# Patient Record
Sex: Female | Born: 1945 | Race: White | Hispanic: No | Smoking: Never smoker
Health system: Southern US, Community
[De-identification: ages and names within clinical notes are randomized; demographics above are authoritative.]

## PROBLEM LIST (undated history)

## (undated) DIAGNOSIS — I671 Cerebral aneurysm, nonruptured: Secondary | ICD-10-CM

## (undated) DIAGNOSIS — I10 Essential (primary) hypertension: Secondary | ICD-10-CM

## (undated) DIAGNOSIS — R7303 Prediabetes: Secondary | ICD-10-CM

## (undated) DIAGNOSIS — M1990 Unspecified osteoarthritis, unspecified site: Secondary | ICD-10-CM

## (undated) HISTORY — PX: CHOLECYSTECTOMY: SHX55

## (undated) HISTORY — PX: BACK SURGERY: SHX140

## (undated) HISTORY — PX: APPENDECTOMY: SHX54

## (undated) HISTORY — PX: CARPAL TUNNEL RELEASE: SHX101

## (undated) HISTORY — PX: FRACTURE SURGERY: SHX138

## (undated) HISTORY — PX: ABDOMINAL HYSTERECTOMY: SHX81

---

## 2006-06-16 ENCOUNTER — Ambulatory Visit: Payer: Self-pay

## 2006-09-04 ENCOUNTER — Ambulatory Visit: Payer: Self-pay | Admitting: Unknown Physician Specialty

## 2008-06-13 ENCOUNTER — Ambulatory Visit: Payer: Self-pay | Admitting: Internal Medicine

## 2008-07-17 ENCOUNTER — Ambulatory Visit: Payer: Self-pay | Admitting: Gastroenterology

## 2009-09-24 ENCOUNTER — Ambulatory Visit: Payer: Self-pay | Admitting: Internal Medicine

## 2010-06-17 ENCOUNTER — Ambulatory Visit: Payer: Self-pay | Admitting: Internal Medicine

## 2012-04-22 ENCOUNTER — Ambulatory Visit: Payer: Self-pay | Admitting: Internal Medicine

## 2012-11-29 ENCOUNTER — Emergency Department: Payer: Self-pay | Admitting: Emergency Medicine

## 2012-12-02 ENCOUNTER — Emergency Department: Payer: Self-pay | Admitting: Emergency Medicine

## 2013-04-07 ENCOUNTER — Ambulatory Visit: Payer: Self-pay | Admitting: Internal Medicine

## 2013-04-10 ENCOUNTER — Ambulatory Visit: Payer: Self-pay | Admitting: Internal Medicine

## 2014-03-06 ENCOUNTER — Ambulatory Visit: Payer: Self-pay | Admitting: Internal Medicine

## 2014-03-14 ENCOUNTER — Ambulatory Visit: Payer: Self-pay | Admitting: Internal Medicine

## 2014-08-17 ENCOUNTER — Other Ambulatory Visit: Payer: Self-pay | Admitting: Internal Medicine

## 2014-08-17 DIAGNOSIS — Z1231 Encounter for screening mammogram for malignant neoplasm of breast: Secondary | ICD-10-CM

## 2014-08-21 ENCOUNTER — Other Ambulatory Visit: Payer: Self-pay | Admitting: Internal Medicine

## 2014-08-21 DIAGNOSIS — Z1231 Encounter for screening mammogram for malignant neoplasm of breast: Secondary | ICD-10-CM

## 2014-09-12 ENCOUNTER — Ambulatory Visit
Admission: RE | Admit: 2014-09-12 | Discharge: 2014-09-12 | Disposition: A | Discharge status: Home / Self Care | Payer: Medicare HMO | Source: Ambulatory Visit | Attending: Internal Medicine | Admitting: Internal Medicine

## 2014-09-12 ENCOUNTER — Other Ambulatory Visit: Payer: Self-pay | Admitting: Internal Medicine

## 2014-09-12 ENCOUNTER — Ambulatory Visit: Payer: Medicare HMO

## 2014-09-12 DIAGNOSIS — Z1231 Encounter for screening mammogram for malignant neoplasm of breast: Secondary | ICD-10-CM

## 2014-09-12 DIAGNOSIS — R921 Mammographic calcification found on diagnostic imaging of breast: Secondary | ICD-10-CM | POA: Insufficient documentation

## 2015-04-17 ENCOUNTER — Other Ambulatory Visit: Payer: Self-pay | Admitting: Internal Medicine

## 2015-04-17 DIAGNOSIS — R928 Other abnormal and inconclusive findings on diagnostic imaging of breast: Secondary | ICD-10-CM

## 2015-05-01 ENCOUNTER — Ambulatory Visit
Admission: RE | Admit: 2015-05-01 | Discharge: 2015-05-01 | Disposition: A | Discharge status: Home / Self Care | Payer: Medicare HMO | Source: Ambulatory Visit | Attending: Internal Medicine | Admitting: Internal Medicine

## 2015-05-01 DIAGNOSIS — Z1231 Encounter for screening mammogram for malignant neoplasm of breast: Secondary | ICD-10-CM | POA: Insufficient documentation

## 2015-05-01 DIAGNOSIS — R928 Other abnormal and inconclusive findings on diagnostic imaging of breast: Secondary | ICD-10-CM

## 2016-01-18 ENCOUNTER — Emergency Department
Admission: EM | Admit: 2016-01-18 | Discharge: 2016-01-18 | Disposition: A | Discharge status: Home / Self Care | Payer: Medicare HMO | Attending: Emergency Medicine | Admitting: Emergency Medicine

## 2016-01-18 ENCOUNTER — Encounter: Payer: Self-pay | Admitting: Emergency Medicine

## 2016-01-18 DIAGNOSIS — M545 Low back pain: Secondary | ICD-10-CM | POA: Insufficient documentation

## 2016-01-18 DIAGNOSIS — G8929 Other chronic pain: Secondary | ICD-10-CM | POA: Diagnosis not present

## 2016-01-18 DIAGNOSIS — I10 Essential (primary) hypertension: Secondary | ICD-10-CM | POA: Insufficient documentation

## 2016-01-18 DIAGNOSIS — M549 Dorsalgia, unspecified: Secondary | ICD-10-CM

## 2016-01-18 MED ORDER — HYDROMORPHONE HCL 1 MG/ML IJ SOLN
1.0000 mg | Freq: Once | INTRAMUSCULAR | Status: AC
  Administered 2016-01-18: 1 mg via INTRAMUSCULAR
  Filled 2016-01-18: qty 1

## 2016-01-18 MED ORDER — TRAMADOL HCL 50 MG PO TABS
50.0000 mg | ORAL_TABLET | Freq: Four times a day (QID) | ORAL | 0 refills | Status: DC | PRN
Start: 2016-01-18 — End: 2016-02-25

## 2016-01-18 MED ORDER — KETOROLAC TROMETHAMINE 60 MG/2ML IM SOLN
30.0000 mg | Freq: Once | INTRAMUSCULAR | Status: AC
  Administered 2016-01-18: 30 mg via INTRAMUSCULAR
  Filled 2016-01-18: qty 2

## 2016-01-18 NOTE — ED Triage Notes (Signed)
Pt with chronic pain but otc meds not helping. Was seen at [facility] same earlier this week. Pt ambulated to triage with no difficulty noted.

## 2016-01-18 NOTE — ED Provider Notes (Signed)
[facility] Emergency Department Provider Note   ____________________________________________   None    (approximate)  I have reviewed the triage vital signs and the nursing notes.   HISTORY  Chief Complaint Back Pain    HPI Krista Anderson is a 70 y.o. female patient complain of chronic low back pain with radicular component to the right leg. Patient denies any bladder or bowel dysfunction. Patient states she was seen at 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 Patient state medications are not helping. Patient currently rates the pain as a 10 over 10. Patient described the pain as sharp. No other palliative measures for his complaint.   History reviewed. No pertinent past medical history.  There are no active problems to display for this patient.   Past Surgical History:  Procedure Laterality Date  . ABDOMINAL HYSTERECTOMY      Prior to Admission medications   Not on File    Allergies Amitriptyline and Sulfur  No family history on file.  Social History Social History  Substance Use Topics  . Smoking status: Never Smoker  . Smokeless tobacco: Never Used  . Alcohol use No    Review of Systems Constitutional: No fever/chills Eyes: No visual changes. ENT: No sore throat. Cardiovascular: Denies chest pain. Respiratory: Denies shortness of breath. Gastrointestinal: No abdominal pain.  No nausea, no vomiting.  No diarrhea.  No constipation. Genitourinary: Negative for dysuria. Musculoskeletal:Positive for chronic back pain  Skin: Negative for rash. Neurological: Negative for headaches, focal weakness or numbness. Endocrine:Hypertension and borderline diabetes. Allergic/Immunilogical: See medication list ____________________________________________   PHYSICAL EXAM:  VITAL  SIGNS: ED Triage Vitals  Enc Vitals Group     BP 01/18/16 0927 (!) 192/67     Pulse Rate 01/18/16 0927 (!) 58     Resp 01/18/16 0927 18     Temp 01/18/16 0927 97.5 F (36.4 C)     Temp Source 01/18/16 0927 Oral     SpO2 01/18/16 0927 95 %     Weight 01/18/16 0929 229 lb (103.9 kg)     Height 01/18/16 0929 5\' 4"  (1.626 m)     Head Circumference --      Peak Flow --      Pain Score 01/18/16 0929 10     Pain Loc --      Pain Edu? --      Excl. in GC? --     Constitutional: Alert and oriented. Well appearing and in no acute distress. Eyes: Conjunctivae are normal. PERRL. EOMI. Head: Atraumatic. Nose: No congestion/rhinnorhea. Mouth/Throat: Mucous membranes are moist.  Oropharynx non-erythematous. Neck: No stridor.  No cervical spine tenderness to palpation. Hematological/Lymphatic/Immunilogical: No cervical lymphadenopathy. Cardiovascular: Normal rate, regular rhythm. Grossly normal heart sounds.  Good peripheral circulation.Elevated blood pressure will be retaken before [facility] to [facility]. Respiratory: Normal respiratory effort.  No retractions. Lungs CTAB. Gastrointestinal: Soft and nontender. No distention. No abdominal bruits. No CVA tenderness. Musculoskeletal: No obvious deformity to the lumbar spine. Surgical scars consistent with previous history. Patient has some moderate guarding palpation of L3-S1. Patient had negative straight leg test. Patient states this time her Lasix on upper extremity. Patient has a normal gait. Neurologic:  Normal speech and language. No gross focal neurologic deficits are appreciated. No gait instability. Skin:  Skin is warm, dry and intact. No rash noted. Psychiatric:  Mood and affect are normal. Speech and behavior are normal.  ____________________________________________   LABS (all labs ordered are listed, but only abnormal results are displayed)  Labs Reviewed - No data to  display ____________________________________________  EKG   ____________________________________________  RADIOLOGY   ____________________________________________   PROCEDURES  Procedure(s) performed: None  Procedures  Critical Care performed: No  ____________________________________________   INITIAL IMPRESSION / ASSESSMENT AND PLAN / ED COURSE  Pertinent labs & imaging results that were available during my care of the patient were reviewed by me and considered in my medical decision making (see chart for details). Chronic back pain. Patient advised to continue previous medication. Patient given a prescription for tramadol and advised to follow-up with family doctor to consider consult pain management for continued care.  Clinical Course   Discussed with patient the rationale to see her family doctor to consider consult to pain management clinic. Advised patient were happy to treat her today for pain complaint but must see continued care outside of the emergency room for this complaint.  ____________________________________________   FINAL CLINICAL IMPRESSION(S) / ED DIAGNOSES  Final diagnoses:  None      NEW MEDICATIONS STARTED DURING THIS VISIT:  New Prescriptions   No medications on file     Note:  This document was prepared using Dragon voice recognition software and may include unintentional dictation errors.    Joni Reining, PA-C 01/18/16 1100    Arnaldo Natal, MD 01/18/16 [phone]

## 2016-01-18 NOTE — ED Notes (Signed)
Patient states that she has been having lower back pain with radiculopathy into the right leg. Patient was seen at [facility] on Wednesday and states that she was given Voltaren gel, Prednisone, and Zanaflex but this has not helped. Patient states that she was unable to sleep last night due to the pain.

## 2016-01-23 ENCOUNTER — Encounter: Payer: Self-pay | Admitting: Emergency Medicine

## 2016-01-23 ENCOUNTER — Emergency Department: Payer: Medicare HMO

## 2016-01-23 ENCOUNTER — Emergency Department
Admission: EM | Admit: 2016-01-23 | Discharge: 2016-01-23 | Disposition: A | Discharge status: Home / Self Care | Payer: Medicare HMO | Attending: Emergency Medicine | Admitting: Emergency Medicine

## 2016-01-23 DIAGNOSIS — I10 Essential (primary) hypertension: Secondary | ICD-10-CM | POA: Insufficient documentation

## 2016-01-23 DIAGNOSIS — M545 Low back pain: Secondary | ICD-10-CM | POA: Diagnosis not present

## 2016-01-23 DIAGNOSIS — M549 Dorsalgia, unspecified: Secondary | ICD-10-CM

## 2016-01-23 DIAGNOSIS — G8929 Other chronic pain: Secondary | ICD-10-CM | POA: Diagnosis not present

## 2016-01-23 HISTORY — DX: Essential (primary) hypertension: I10

## 2016-01-23 HISTORY — DX: Prediabetes: R73.03

## 2016-01-23 MED ORDER — NAPROXEN 500 MG PO TABS: 500.0000 mg | ORAL_TABLET | Freq: Two times a day (BID) | ORAL | 0 refills | Status: DC

## 2016-01-23 MED ORDER — BACLOFEN 10 MG PO TABS: 10.0000 mg | ORAL_TABLET | Freq: Three times a day (TID) | ORAL | 0 refills | Status: DC

## 2016-01-23 MED ORDER — GABAPENTIN 100 MG PO CAPS: 100.0000 mg | ORAL_CAPSULE | Freq: Three times a day (TID) | ORAL | 0 refills | Status: DC

## 2016-01-23 NOTE — ED Triage Notes (Signed)
Pt reports history of back surgeries in the past. Reports for the past week her lower back has been hurting and now she has pain in her right hip radiating down her right leg and causing some numbness. Denies recent injuries.

## 2016-01-23 NOTE — ED Provider Notes (Signed)
[facility] Emergency Department Provider Note   ____________________________________________   None    (approximate)  I have reviewed the triage vital signs and the nursing notes.   HISTORY  Chief Complaint Hip Pain and Back Pain    HPI Krista Anderson is a 70 y.o. female presents for evaluation of low back and right hip pain. She states numbness and tingling radiating down her leg causing a burning sensation. Denies any numbness or tingling within the groin itself.  Past medical history is significant for previous back surgeries.   Past Medical History:  Diagnosis Date  . Borderline diabetes   . Hypertension     There are no active problems to display for this patient.   Past Surgical History:  Procedure Laterality Date  . ABDOMINAL HYSTERECTOMY    . APPENDECTOMY    . BACK SURGERY    . CHOLECYSTECTOMY      Prior to Admission medications   Medication Sig Start Date End Date Taking? Authorizing Provider  baclofen (LIORESAL) 10 MG tablet Take 1 tablet (10 mg total) by mouth 3 (three) times daily. 01/23/16   Charmayne Sheerharles M Prynce Jacober, PA-C  gabapentin (NEURONTIN) 100 MG capsule Take 1 capsule (100 mg total) by mouth 3 (three) times daily. 01/23/16 01/22/17  Charmayne Sheerharles M Jacquel Mccamish, PA-C  naproxen (NAPROSYN) 500 MG tablet Take 1 tablet (500 mg total) by mouth 2 (two) times daily with a meal. 01/23/16   Evangeline Dakinharles M Jaquell Seddon, PA-C  traMADol (ULTRAM) 50 MG tablet Take 1 tablet (50 mg total) by mouth every 6 (six) hours as needed. 01/18/16 01/17/17  Joni Reiningonald K Smith, PA-C    Allergies Amitriptyline and Sulfur  No family history on file.  Social History Social History  Substance Use Topics  . Smoking status: Never Smoker  . Smokeless tobacco: Never Used  . Alcohol use No    Review of Systems Constitutional: No fever/chills Cardiovascular: Denies chest pain. Respiratory: Denies shortness of breath. Gastrointestinal: No abdominal pain.  No nausea, no vomiting.  No  diarrhea.  No constipation. Genitourinary: Negative for dysuria. Musculoskeletal: Positive for low back pain. Skin: Negative for rash. Neurological: Negative for headaches, focal weakness or numbness.  10-point ROS otherwise negative.  ____________________________________________   PHYSICAL EXAM:  VITAL SIGNS: ED Triage Vitals [01/23/16 0815]  Enc Vitals Group     BP (!) 142/56     Pulse Rate 83     Resp 16     Temp 97.9 F (36.6 C)     Temp Source Oral     SpO2 96 %     Weight 229 lb (103.9 kg)     Height 5\' 4"  (1.626 m)     Head Circumference      Peak Flow      Pain Score 10     Pain Loc      Pain Edu?      Excl. in GC?     Constitutional: Alert and oriented. Well appearing and in no acute distress. Eyes: Conjunctivae are normal. PERRL. EOMI. Head: Atraumatic. Nose: No congestion/rhinnorhea. Mouth/Throat: Mucous membranes are moist.  Oropharynx non-erythematous. Neck: No stridor.  Supple, full range of motion nontender. Cardiovascular: Normal rate, regular rhythm. Grossly normal heart sounds.  Good peripheral circulation. Respiratory: Normal respiratory effort.  No retractions. Lungs CTAB. Gastrointestinal: Soft and nontender. No distention. No abdominal bruits. No CVA tenderness. Musculoskeletal: No lower extremity tenderness nor edema.  No joint effusions. Straight leg raise positive increased pain on the right  Neurologic:  Normal speech and language. No gross focal neurologic deficits are appreciated. No gait instability. Distally, neurovascularly intact. Skin:  Skin is warm, dry and intact. No rash noted. Psychiatric: Mood and affect are normal. Speech and behavior are normal.  ____________________________________________   LABS (all labs ordered are listed, but only abnormal results are displayed)  Labs Reviewed - No data to  display ____________________________________________  EKG   ____________________________________________  RADIOLOGY  IMPRESSION:  1. 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 Bilateral foraminal narrowing that is greater on the  right due to probable foraminal disc protrusion. No better  explanation for right-sided radicular pain.  2. Degenerative changes without impingement are described above.  3. Postoperative L5-S1 level with interbody fusion.    ____________________________________________   PROCEDURES  Procedure(s) performed: None  Procedures  Critical Care performed: No  ____________________________________________   INITIAL IMPRESSION / ASSESSMENT AND PLAN / ED COURSE  Pertinent labs & imaging results that were available during my care of the patient were reviewed by me and considered in my medical decision making (see chart for details).  Acute exacerbation of recurrent low back pain. Foraminal narrowing noted. Rx given for gabapentin, baclofen, Naprosyn. Patient follow-up with neurosurgery as needed for continued pain.  Clinical Course     ____________________________________________   FINAL CLINICAL IMPRESSION(S) / ED DIAGNOSES  Final diagnoses:  Midline low back pain, with sciatica presence unspecified  Chronic back pain      NEW MEDICATIONS STARTED DURING THIS VISIT:  New Prescriptions   BACLOFEN (LIORESAL) 10 MG TABLET    Take 1 tablet (10 mg total) by mouth 3 (three) times daily.   GABAPENTIN (NEURONTIN) 100 MG CAPSULE    Take 1 capsule (100 mg total) by mouth 3 (three) times daily.   NAPROXEN (NAPROSYN) 500 MG TABLET    Take 1 tablet (500 mg total) by mouth 2 (two) times daily with a meal.     Note:  This document was prepared using Dragon voice recognition software and may include unintentional dictation errors.   Evangeline Dakin, PA-C 01/23/16 1027    Sharman Cheek, MD 01/23/16  1259

## 2016-01-24 ENCOUNTER — Encounter (HOSPITAL_COMMUNITY): Payer: Self-pay | Admitting: Emergency Medicine

## 2016-01-24 DIAGNOSIS — M549 Dorsalgia, unspecified: Secondary | ICD-10-CM | POA: Diagnosis present

## 2016-01-24 DIAGNOSIS — I10 Essential (primary) hypertension: Secondary | ICD-10-CM | POA: Diagnosis not present

## 2016-01-24 DIAGNOSIS — M5440 Lumbago with sciatica, unspecified side: Secondary | ICD-10-CM | POA: Insufficient documentation

## 2016-01-24 MED ORDER — OXYCODONE-ACETAMINOPHEN 5-325 MG PO TABS
1.0000 | ORAL_TABLET | ORAL | Status: DC | PRN
Start: 2016-01-24 — End: 2016-01-25
  Administered 2016-01-24: 1 via ORAL

## 2016-01-24 MED ORDER — OXYCODONE-ACETAMINOPHEN 5-325 MG PO TABS
ORAL_TABLET | ORAL | Status: AC
  Filled 2016-01-24: qty 1

## 2016-01-24 NOTE — ED Triage Notes (Addendum)
Reports back pain from chronic bulging disc ongoing since Wednesday, states "off the charts." States usually has intermittent pains varying on ADLs, but reports new exacerbation this week with constant pain. Radiates down bilateral hips. States seen at [facility] this week, using baclofen, naproxen, and gabapentin with no relief. Denies loss of control of B&B.   Pt given narcotic pain medicine in triage. Advised of side effects and instructed to avoid driving for a minimum of four hours.

## 2016-01-25 ENCOUNTER — Emergency Department (HOSPITAL_COMMUNITY)
Admission: EM | Admit: 2016-01-25 | Discharge: 2016-01-25 | Disposition: A | Discharge status: Home / Self Care | Payer: Medicare HMO | Attending: Emergency Medicine | Admitting: Emergency Medicine

## 2016-01-25 DIAGNOSIS — M5442 Lumbago with sciatica, left side: Secondary | ICD-10-CM

## 2016-01-25 DIAGNOSIS — M5441 Lumbago with sciatica, right side: Secondary | ICD-10-CM

## 2016-01-25 MED ORDER — MORPHINE SULFATE (PF) 2 MG/ML IV SOLN
2.0000 mg | Freq: Once | INTRAVENOUS | Status: AC
Start: 2016-01-25 — End: 2016-01-25
  Administered 2016-01-25: 2 mg via INTRAMUSCULAR
  Filled 2016-01-25: qty 1

## 2016-01-25 MED ORDER — OXYCODONE-ACETAMINOPHEN 5-325 MG PO TABS: 1.0000 | ORAL_TABLET | Freq: Four times a day (QID) | ORAL | 0 refills | Status: DC | PRN

## 2016-01-25 NOTE — ED Provider Notes (Signed)
[facility]-EMERGENCY DEPT Provider Note   CSN: [id number] Arrival date & time: 01/24/16  2017     History   Chief Complaint Chief Complaint  Patient presents with  . Back Pain    HPI Krista Anderson is a 70 y.o. female.  HPI  A shunt has borderline diabetes and hypertension presents to the emergency department complaints of back pain. She was seen yesterday for the same thing [place], had a CT scan of her back done which showed no acute findings but multiple nonacute abnormalities but be the etiology of the patient's pain. Patient has history of back surgery as well. She says that they forgot to give her a shot of pain medicine before she left perceptions for Naprosyn, gabapentin and baclofen are not working. She was referred to Dr. Dutch QuintPoole with neurosurgery who she has not yet had a chance to call and make an appointment for. She is here to get pain control.  She has not had any change in her back pain, loss of bowel or urine control, fevers, falling, confusion, headache, and numbness or weakness.  Past Medical History:  Diagnosis Date  . Borderline diabetes   . Hypertension     There are no active problems to display for this patient.   Past Surgical History:  Procedure Laterality Date  . ABDOMINAL HYSTERECTOMY    . APPENDECTOMY    . BACK SURGERY    . CHOLECYSTECTOMY      OB History    No data available       Home Medications    Prior to Admission medications   Medication Sig Start Date End Date Taking? Authorizing Provider  lisinopril (PRINIVIL,ZESTRIL) 20 MG tablet Take 20 mg by mouth daily.   Yes Historical Provider, MD  pravastatin (PRAVACHOL) 10 MG tablet Take 10 mg by mouth daily.   Yes Historical Provider, MD  baclofen (LIORESAL) 10 MG tablet Take 1 tablet (10 mg total) by mouth 3 (three) times daily. 01/23/16   Charmayne Sheerharles M Beers, PA-C  gabapentin (NEURONTIN) 100 MG capsule Take 1 capsule (100 mg total) by mouth 3 (three) times daily. 01/23/16 01/22/17  Charmayne Sheerharles M Beers,  PA-C  naproxen (NAPROSYN) 500 MG tablet Take 1 tablet (500 mg total) by mouth 2 (two) times daily with a meal. 01/23/16   Evangeline Dakinharles M Beers, PA-C  traMADol (ULTRAM) 50 MG tablet Take 1 tablet (50 mg total) by mouth every 6 (six) hours as needed. 01/18/16 01/17/17  Joni Reiningonald K Smith, PA-C    Family History No family history on file.  Social History Social History  Substance Use Topics  . Smoking status: Never Smoker  . Smokeless tobacco: Never Used  . Alcohol use No     Allergies   Amitriptyline and Sulfur   Review of Systems Review of Systems Review of Systems All other systems negative except as documented in the HPI. All pertinent positives and negatives as reviewed in the HPI.   Physical Exam Updated Vital Signs BP 152/69 (BP Location: Left Arm)   Pulse 60   Temp 98.1 F (36.7 C) (Oral)   Resp 16   SpO2 97%   Physical Exam  Constitutional: She appears well-developed and well-nourished. No distress.  HENT:  Head: Normocephalic and atraumatic.  Eyes: Pupils are equal, round, and reactive to light.  Neck: Normal range of motion. Neck supple.  Cardiovascular: Normal rate and regular rhythm.   Pulmonary/Chest: Effort normal.  Abdominal: Soft.  Musculoskeletal:  Symmetrical and physiologic strength to bilateral lower extremities.  Neurosensory function adequate to both legs Skin color is normal. Skin is warm and moist.  No step off deformity appreciated and no midline bony tenderness.  Ambulatory  No crepitus, laceration, effusion, induration, lesions Pedal pulses are symmetrical and palpable bilaterally  Tenderness to palpation of paraspinal and midline of spine and bilateral hips No clonus on dorsiflextion   Neurological: She is alert.  Skin: Skin is warm and dry.  Nursing note and vitals reviewed.    ED Treatments / Results  Labs (all labs ordered are listed, but only abnormal results are displayed) Labs Reviewed - No data to display  EKG  EKG  Interpretation None       Radiology Ct Lumbar Spine Wo Contrast  Result Date: 01/23/2016 CLINICAL DATA:  History of lumbar surgery. Lower back pain radiating down the right leg for 9 days. EXAM: CT LUMBAR SPINE WITHOUT CONTRAST TECHNIQUE: Multidetector CT imaging of the lumbar spine was performed without intravenous contrast administration. Multiplanar CT image reconstructions were also generated. COMPARISON:  None available FINDINGS: Segmentation: 5 lumbar type vertebrae. Alignment: Facet mediated L3-4 grade 1 anterolisthesis. Vertebrae: No acute fracture or focal pathologic process. Osteopenia Paraspinal and other soft tissues: Atherosclerotic calcification of the aorta. Cholecystectomy. Disc levels: T12- L1: Unremarkable. L1-L2: Minimal spondylosis and facet spurring. No impingement identified. L2-L3: Annulus bulging and mild degenerative facet spurring with left facet vacuum phenomenon. No impingement L3-L4: Advanced facet arthropathy with anterolisthesis. The uncovered narrowed disc is bulging. On axial source images suspected right foraminal disc protrusion with L3 mass effect. Canal stenosis is moderate range. L4-L5: Disc narrowing and bulging with vacuum phenomenon. Mild hyper trophic facet arthropathy. Inferior foraminal narrowing without impingement. No significant canal stenosis. L5-S1:Interbody fusion in the setting of laminotomy. Facet arthropathy mild spurring. No evidence residual impingement. IMPRESSION: 1. 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 Bilateral foraminal narrowing that is greater on the right due to probable foraminal disc protrusion. No better explanation for right-sided radicular pain. 2. Degenerative changes without impingement are described above. 3. Postoperative L5-S1 level with interbody fusion. Electronically Signed   By: Marnee Spring M.D.   On: 01/23/2016 09:59    Procedures Procedures (including critical care  time)  Medications Ordered in ED Medications  oxyCODONE-acetaminophen (PERCOCET/ROXICET) 5-325 MG per tablet 1 tablet (1 tablet Oral Given 01/24/16 2033)  oxyCODONE-acetaminophen (PERCOCET/ROXICET) 5-325 MG per tablet (not administered)  morphine 2 MG/ML injection 2 mg (not administered)     Initial Impression / Assessment and Plan / ED Course  I have reviewed the triage vital signs and the nursing notes.  Pertinent labs & imaging results that were available during my care of the patient were reviewed by me and considered in my medical decision making (see chart for details).  Clinical Course    Will prescribe a small rx for Percocet for breakthrough pain. Patient needs to make an appointment to see Dr. Dutch Quint to be seen as much as possible Pt does not appear to be in any distress and moves freely in stretcher.   Case discussed with Dr. Fayrene Fearing prior to her discharge.  Final Clinical Impressions(s) / ED Diagnoses   Final diagnoses:  Bilateral low back pain with sciatica, sciatica laterality unspecified    New Prescriptions New Prescriptions   No medications on file     Marlon Pel, PA-C 01/25/16 0134    Marlon Pel, PA-C 01/25/16 0135    Rolland Porter, MD 01/30/16 [phone]

## 2016-01-25 NOTE — ED Provider Notes (Signed)
[facility]PT Provider Note   CSN: [id number] Arrival date & time: 01/24/16  2017     History   Chief Complaint Chief Complaint  Patient presents with  . Back Pain    HPI Krista Anderson is a 70 y.o. female.  HPI   Patient has PMH of borderline diabetes and hypertension and hx of back surgery and chronic back pain.  Patient to the ER complaining of back pain from a chronic bulging disc that started on Wednesday. She describes the pain as "off the charts". She says that is has been affecting her ADLs but this pain she Is having today is different and worse than her chronic pain. The pain is constant and radiates down to bilateral hips.   She was seen [facility] this week and was given rx for Baclofen, naproxen, gabapentin with no relief. She has not had loss of bowel or bladder control. In triage she was given narcotic pain medications, the patient says that this has helped .  Past Medical History:  Diagnosis Date  . Borderline diabetes   . Hypertension     There are no active problems to display for this patient.   Past Surgical History:  Procedure Laterality Date  . ABDOMINAL HYSTERECTOMY    . APPENDECTOMY    . BACK SURGERY    . CHOLECYSTECTOMY      OB History    No data available       Home Medications    Prior to Admission medications   Medication Sig Start Date End Date Taking? Authorizing Provider  lisinopril (PRINIVIL,ZESTRIL) 20 MG tablet Take 20 mg by mouth daily.   Yes Historical Provider, MD  pravastatin (PRAVACHOL) 10 MG tablet Take 10 mg by mouth daily.   Yes Historical Provider, MD  baclofen (LIORESAL) 10 MG tablet Take 1 tablet (10 mg total) by mouth 3 (three) times daily. 01/23/16   Charmayne Sheerharles M Beers, PA-C  gabapentin (NEURONTIN) 100 MG capsule Take 1 capsule (100 mg total) by mouth 3 (three) times daily. 01/23/16 01/22/17  Charmayne Sheerharles M Beers, PA-C  naproxen (NAPROSYN) 500 MG tablet Take 1 tablet (500 mg total) by mouth 2 (two) times daily with a  meal. 01/23/16   Evangeline Dakinharles M Beers, PA-C  oxyCODONE-acetaminophen (PERCOCET/ROXICET) 5-325 MG tablet Take 1 tablet by mouth every 6 (six) hours as needed for severe pain. 01/25/16   Lashaya Kienitz Neva SeatGreene, PA-C  traMADol (ULTRAM) 50 MG tablet Take 1 tablet (50 mg total) by mouth every 6 (six) hours as needed. 01/18/16 01/17/17  Joni Reiningonald K Smith, PA-C    Family History No family history on file.  Social History Social History  Substance Use Topics  . Smoking status: Never Smoker  . Smokeless tobacco: Never Used  . Alcohol use No     Allergies   Amitriptyline and Sulfur   Review of Systems Review of Systems  Review of Systems All other systems negative except as documented in the HPI. All pertinent positives and negatives as reviewed in the HPI.  Physical Exam Updated Vital Signs BP 152/69 (BP Location: Left Arm)   Pulse 60   Temp 98.1 F (36.7 C) (Oral)   Resp 16   SpO2 97%   Physical Exam  Constitutional: She appears well-developed and well-nourished. No distress.  HENT:  Head: Normocephalic and atraumatic.  Eyes: Pupils are equal, round, and reactive to light.  Neck: Normal range of motion. Neck supple.  Cardiovascular: Normal rate and regular rhythm.   Pulmonary/Chest: Effort normal.  Abdominal:  Soft.  Neurological: She is alert.  Skin: Skin is warm and dry.  Nursing note and vitals reviewed.   ED Treatments / Results  Labs (all labs ordered are listed, but only abnormal results are displayed) Labs Reviewed - No data to display  EKG  EKG Interpretation None       Radiology Ct Lumbar Spine Wo Contrast  Result Date: 01/23/2016 CLINICAL DATA:  History of lumbar surgery. Lower back pain radiating down the right leg for 9 days. EXAM: CT LUMBAR SPINE WITHOUT CONTRAST TECHNIQUE: Multidetector CT imaging of the lumbar spine was performed without intravenous contrast administration. Multiplanar CT image reconstructions were also generated. COMPARISON:  None available  FINDINGS: Segmentation: 5 lumbar type vertebrae. Alignment: Facet mediated L3-4 grade 1 anterolisthesis. Vertebrae: No acute fracture or focal pathologic process. Osteopenia Paraspinal and other soft tissues: Atherosclerotic calcification of the aorta. Cholecystectomy. Disc levels: T12- L1: Unremarkable. L1-L2: Minimal spondylosis and facet spurring. No impingement identified. L2-L3: Annulus bulging and mild degenerative facet spurring with left facet vacuum phenomenon. No impingement L3-L4: Advanced facet arthropathy with anterolisthesis. The uncovered narrowed disc is bulging. On axial source images suspected right foraminal disc protrusion with L3 mass effect. Canal stenosis is moderate range. L4-L5: Disc narrowing and bulging with vacuum phenomenon. Mild hyper trophic facet arthropathy. Inferior foraminal narrowing without impingement. No significant canal stenosis. L5-S1:Interbody fusion in the setting of laminotomy. Facet arthropathy mild spurring. No evidence residual impingement.  IMPRESSION:  1. 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 Bilateral foraminal narrowing that is greater on the right due to probable foraminal disc protrusion. No better explanation for right-sided radicular pain.   2. Degenerative changes without impingement are described above.   3. Postoperative L5-S1 level with interbody fusion.   Electronically Signed   By: Marnee Spring M.D.   On: 01/23/2016 09:59    Procedures Procedures (including critical care time)  Medications Ordered in ED Medications  morphine 2 MG/ML injection 2 mg (2 mg Intramuscular Given 01/25/16 0200)   Initial Impression / Assessment and Plan / ED Course  I have reviewed the triage vital signs and the nursing notes.  Pertinent labs & imaging results that were available during my care of the patient were reviewed by me and considered in my medical decision making (see chart for details).  Clinical  Course   70 y.o.Krista Anderson's  with back pain.   No neurological deficits and normal neuro exam. No loss of bowel or bladder control. No concern for cauda equina at this time base on HPI and physical exam findings. No fever, night sweats, weight loss, h/o cancer, IVDU. The patient can walk with some discomfort.   Patient Plan 1. Medications: NSAIDs and/or muscle relaxer. Cont usual home medications unless otherwise directed. 2. Treatment: rest, drink plenty of fluids, gentle stretching as discussed, alternate ice and heat  3. Follow Up: Please followup with your primary doctor for discussion of your diagnoses and further evaluation after today's visit; if you do not have a primary care doctor use the resource guide provided to find one  Advised to follow-up with the orthopedist if symptoms do not start to resolve in the next 2-3 days. If develop loss of bowel or urinary control return to the ED as soon as possible for further evaluation. To take the medications as prescribed as they can cause harm if not taken appropriately.   Vital signs are stable at discharge. Vitals:   01/24/16 2024 01/24/16 2251  BP: 181/71  152/69  Pulse: 72 60  Resp: 16 16  Temp: 98.1 F (36.7 C)     Patient/guardian has voiced understanding and agreed to follow-up with the PCP or specialist.   Final Clinical Impressions(s) / ED Diagnoses   Final diagnoses:  Bilateral low back pain with sciatica, sciatica laterality unspecified    New Prescriptions Discharge Medication List as of 01/25/2016  1:45 AM    START taking these medications   Details  oxyCODONE-acetaminophen (PERCOCET/ROXICET) 5-325 MG tablet Take 1 tablet by mouth every 6 (six) hours as needed for severe pain., Starting Sat 01/25/2016, Print         Tanylah Schnoebelen Neva Seat, PA-C 01/25/16 0454    Rolland Porter, MD 01/30/16 [phone]

## 2016-01-30 ENCOUNTER — Emergency Department (HOSPITAL_COMMUNITY)
Admission: EM | Admit: 2016-01-30 | Discharge: 2016-01-30 | Disposition: A | Discharge status: Home / Self Care | Payer: Medicare HMO | Attending: Emergency Medicine | Admitting: Emergency Medicine

## 2016-01-30 ENCOUNTER — Encounter (HOSPITAL_COMMUNITY): Payer: Self-pay | Admitting: *Deleted

## 2016-01-30 DIAGNOSIS — M5442 Lumbago with sciatica, left side: Secondary | ICD-10-CM | POA: Diagnosis not present

## 2016-01-30 DIAGNOSIS — G8929 Other chronic pain: Secondary | ICD-10-CM

## 2016-01-30 DIAGNOSIS — M5441 Lumbago with sciatica, right side: Secondary | ICD-10-CM | POA: Insufficient documentation

## 2016-01-30 DIAGNOSIS — M5431 Sciatica, right side: Secondary | ICD-10-CM

## 2016-01-30 DIAGNOSIS — Z79899 Other long term (current) drug therapy: Secondary | ICD-10-CM | POA: Insufficient documentation

## 2016-01-30 DIAGNOSIS — M545 Low back pain: Secondary | ICD-10-CM | POA: Diagnosis present

## 2016-01-30 DIAGNOSIS — M5432 Sciatica, left side: Secondary | ICD-10-CM

## 2016-01-30 DIAGNOSIS — I10 Essential (primary) hypertension: Secondary | ICD-10-CM | POA: Diagnosis not present

## 2016-01-30 MED ORDER — CYCLOBENZAPRINE HCL 10 MG PO TABS
10.0000 mg | ORAL_TABLET | Freq: Once | ORAL | Status: AC
  Administered 2016-01-30: 10 mg via ORAL
  Filled 2016-01-30: qty 1

## 2016-01-30 MED ORDER — OXYCODONE-ACETAMINOPHEN 5-325 MG PO TABS
1.0000 | ORAL_TABLET | Freq: Once | ORAL | Status: AC
  Administered 2016-01-30: 1 via ORAL
  Filled 2016-01-30: qty 1

## 2016-01-30 MED ORDER — KETOROLAC TROMETHAMINE 30 MG/ML IJ SOLN
30.0000 mg | Freq: Once | INTRAMUSCULAR | Status: AC
  Administered 2016-01-30: 30 mg via INTRAMUSCULAR
  Filled 2016-01-30: qty 1

## 2016-01-30 MED ORDER — LIDOCAINE 5 % EX PTCH
1.0000 | MEDICATED_PATCH | CUTANEOUS | Status: DC
  Administered 2016-01-30: 1 via TRANSDERMAL
  Filled 2016-01-30: qty 1

## 2016-01-30 MED ORDER — LIDOCAINE 5 % EX PTCH: 1.0000 | MEDICATED_PATCH | CUTANEOUS | 0 refills | Status: DC

## 2016-01-30 NOTE — ED Notes (Signed)
Pt verbalized understanding of d/c instructions and has no further questions. Pt stable and NAD. Pt to follow up with ortho on Wed next week.

## 2016-01-30 NOTE — ED Provider Notes (Signed)
[facility] Provider Note   CSN: [id number] Arrival date & time: 01/30/16  1507     History   Chief Complaint Chief Complaint  Patient presents with  . Back Pain  . Leg Pain    HPI Krista Anderson is a 70 y.o. female.  The history is provided by the patient (joined in ED by female friend/companion).  Back Pain   This is a chronic (acute on chronic since 1990's) problem. The problem occurs daily. The problem has not changed since onset.The pain is associated with no known injury. The pain is present in the lumbar spine and sacro-iliac joint. The quality of the pain is described as shooting. The pain radiates to the right thigh and left thigh. The pain is severe. The symptoms are aggravated by certain positions (worse sleeping or leaning toward right side, worse with ambulation). Associated symptoms include tingling. Pertinent negatives include no chest pain, no fever, no numbness, no headaches, no abdominal pain, no bowel incontinence, no bladder incontinence, no pelvic pain and no weakness. She has tried muscle relaxants and analgesics for the symptoms. The treatment provided mild relief. Risk factors include obesity.    Past Medical History:  Diagnosis Date  . Borderline diabetes   . Hypertension     There are no active problems to display for this patient.   Past Surgical History:  Procedure Laterality Date  . ABDOMINAL HYSTERECTOMY    . APPENDECTOMY    . BACK SURGERY    . CHOLECYSTECTOMY      OB History    No data available       Home Medications    Prior to Admission medications   Medication Sig Start Date End Date Taking? Authorizing Provider  baclofen (LIORESAL) 10 MG tablet Take 1 tablet (10 mg total) by mouth 3 (three) times daily. 01/23/16  Yes Charmayne Sheerharles M Beers, PA-C  gabapentin (NEURONTIN) 100 MG capsule Take 1 capsule (100 mg total) by mouth 3 (three) times daily. 01/23/16 01/22/17 Yes Charles M Beers, PA-C  lisinopril (PRINIVIL,ZESTRIL) 20 MG tablet  Take 20 mg by mouth daily.   Yes Historical Provider, MD  naproxen (NAPROSYN) 500 MG tablet Take 1 tablet (500 mg total) by mouth 2 (two) times daily with a meal. 01/23/16  Yes Charmayne Sheerharles M Beers, PA-C  oxyCODONE-acetaminophen (PERCOCET/ROXICET) 5-325 MG tablet Take 1 tablet by mouth every 6 (six) hours as needed for severe pain. 01/25/16  Yes Tiffany Neva SeatGreene, PA-C  pravastatin (PRAVACHOL) 10 MG tablet Take 10 mg by mouth daily.   Yes Historical Provider, MD  lidocaine (LIDODERM) 5 % Place 1 patch onto the skin daily. Remove & Discard patch within 12 hours or as directed by MD 01/30/16   Horald PollenAudrey Elania Crowl, MD  traMADol (ULTRAM) 50 MG tablet Take 1 tablet (50 mg total) by mouth every 6 (six) hours as needed. Patient not taking: Reported on 01/30/2016 01/18/16 01/17/17  Joni Reiningonald K Smith, PA-C    Family History History reviewed. No pertinent family history.  Social History Social History  Substance Use Topics  . Smoking status: Never Smoker  . Smokeless tobacco: Never Used  . Alcohol use No     Allergies   Amitriptyline and Sulfur   Review of Systems Review of Systems  Constitutional: Negative for chills, fatigue and fever.  HENT: Negative for congestion.   Respiratory: Negative for shortness of breath.   Cardiovascular: Negative for chest pain.  Gastrointestinal: Negative for abdominal pain and bowel incontinence.  Genitourinary: Negative for bladder incontinence and  pelvic pain.  Musculoskeletal: Positive for back pain. Negative for arthralgias, myalgias and neck pain.  Skin: Negative for rash.  Neurological: Positive for tingling. Negative for weakness, numbness and headaches.  Psychiatric/Behavioral: Negative for confusion.     Physical Exam Updated Vital Signs BP 144/62   Pulse (!) 58   Temp 98 F (36.7 C) (Oral)   Resp 18   SpO2 97%   Physical Exam  Constitutional: She is oriented to person, place, and time. She appears well-developed and well-nourished. No distress.    Pleasant, cooperative, uncomfortable but well-appearing  HENT:  Head: Normocephalic and atraumatic.  Eyes: Conjunctivae are normal. No scleral icterus.  Neck: Normal range of motion. Neck supple.  Cardiovascular: Normal rate and intact distal pulses.   Pulmonary/Chest: Effort normal.  Abdominal: Soft. She exhibits no distension. There is no tenderness.  Musculoskeletal: She exhibits no edema, tenderness or deformity.  No spinal TTP on exam. Mild right SI TTP. Positive right straight leg raise with pain going down RLE. No worse pain with left straight leg raise  Neurological: She is alert and oriented to person, place, and time. She exhibits normal muscle tone. Coordination normal.  Symmetric 5/5 strength of b/l Le's, intact sensation. Warm and perfused legs with normal color. Symmetric size and appearance of b/l LE's  Skin: Skin is warm and dry. She is not diaphoretic.  Psychiatric: She has a normal mood and affect.  Nursing note and vitals reviewed.    ED Treatments / Results  Labs (all labs ordered are listed, but only abnormal results are displayed) Labs Reviewed - No data to display  EKG  EKG Interpretation None       Radiology No results found.  Procedures Procedures (including critical care time)  Medications Ordered in ED Medications  cyclobenzaprine (FLEXERIL) tablet 10 mg (10 mg Oral Given 01/30/16 1910)  oxyCODONE-acetaminophen (PERCOCET/ROXICET) 5-325 MG per tablet 1 tablet (1 tablet Oral Given 01/30/16 1909)  ketorolac (TORADOL) 30 MG/ML injection 30 mg (30 mg Intramuscular Given 01/30/16 1908)     Initial Impression / Assessment and Plan / ED Course  I have reviewed the triage vital signs and the nursing notes.  Pertinent labs & imaging results that were available during my care of the patient were reviewed by me and considered in my medical decision making (see chart for details).  Clinical Course   Krista Anderson is a 70 y.o. female with a [place] history of  chronic lumbar back pain and h/o herniated disc in 1990's that has rendered pt with ongoing pain, who presents to ED because she states she only has 2 pills left of her Oxycontin, concerned her pain will be even more unmanageable without it tomorrow. Pt denies any recent injuries, is neurovascularly intact, has no red flags, no indication for emergency imaging. B/l sciatica, R>L, RLE sciatica reproducible with RLE straight leg raise, not with LLE straight leg raise. Suspect SI joint pain. No lumbar tenderness.  Spoke to patient about limitations of pain medication refills in the ED for chronic pain. Gave tx as above in ED for pain, Rx lidocaine patches. Advised to keep her already scheduled appointment with [facility] next Wednesday, and to return to ER for numbness/weakness of the legs, for inability to ambulate, for incontinence of stool or urine, for retention of urine, or other new, worse, or concerning symptoms.  Pt condition, course, and discharge were discussed with attending physician Dr. Alvira Monday.  Final Clinical Impressions(s) / ED Diagnoses   Final diagnoses:  Bilateral sciatica  Chronic bilateral low back pain with bilateral sciatica    New Prescriptions Discharge Medication List as of 01/30/2016  8:46 PM    START taking these medications   Details  lidocaine (LIDODERM) 5 % Place 1 patch onto the skin daily. Remove & Discard patch within 12 hours or as directed by MD, Starting Thu 01/30/2016, Print         Horald Pollen, MD 01/31/16 8119    Alvira Monday, MD 02/09/16 1507

## 2016-01-30 NOTE — ED Triage Notes (Signed)
Pt c/o lower back pain. Pt's complaint is exactly the same from her last visit 9/30. Pt has 2 pain pills left. Pt's next appointment is Wednesday to be seen for her back.

## 2016-01-30 NOTE — ED Notes (Signed)
Dr Dalene SeltzerSchlossman in room

## 2016-02-05 ENCOUNTER — Other Ambulatory Visit: Payer: Self-pay | Admitting: Neurosurgery

## 2016-02-20 NOTE — Pre-Procedure Instructions (Signed)
Krista Anderson  02/20/2016      [id number] - [address] ([place]), [place] - [address] [place] ([place]) [place] [id number] Phone: [phone] Fax: [phone]    Your procedure is scheduled on Tuesday, October 31.  [facility] Admitting at 8:50 AM               Your surgery or procedure is scheduled for 10:50 AM   Call this number if you have problems the morning of surgery: [phone]   Remember:  Do not eat food or drink liquids after midnight Monday, October 30.  Take these medicines the morning of surgery with A SIP OF WATER :  gabapentin (NEURONTIN).                May take acetaminophen (TYLENOL) if needed.               1 Week prior to surgery STOP taking Aspirin , Aspirin Products (Goody Powder, Excedrin Migraine), Ibuprofen (Advil), Naproxen (Aleve), Vitamins and Herbal Products (ie Fish Oil)                     [place]- Preparing For Surgery  Before surgery, you can play an important role. Because skin is not sterile, your skin needs to be as free of germs as possible. You can reduce the number of germs on your skin by washing with CHG (chlorahexidine gluconate) Soap before surgery.  CHG is an antiseptic cleaner which kills germs and bonds with the skin to continue killing germs even after washing.  Please do not use if you have an allergy to CHG or antibacterial soaps. If your skin becomes reddened/irritated stop using the CHG.  Do not shave (including legs and underarms) for at least 48 hours prior to first CHG shower. It is OK to shave your face.  Please follow these instructions carefully.   1. Shower the NIGHT BEFORE SURGERY and the MORNING OF SURGERY with CHG.   2. If you chose to wash your hair, wash your hair first as usual with your normal shampoo.  3. After you shampoo, rinse your hair and body thoroughly to remove the shampoo.  4. Use CHG as you would any other liquid soap. You can apply CHG  directly to the skin and wash gently with a scrungie or a clean washcloth.   5. Apply the CHG Soap to your body ONLY FROM THE NECK DOWN.  Do not use on open wounds or open sores. Avoid contact with your eyes, ears, mouth and genitals (private parts). Wash genitals (private parts) with your normal soap.  6. Wash thoroughly, paying special attention to the area where your surgery will be performed.  7. Thoroughly rinse your body with warm water from the neck down.  8. DO NOT shower/wash with your normal soap after using and rinsing off the CHG Soap.  9. Pat yourself dry with a CLEAN TOWEL.   10. Wear CLEAN PAJAMAS   11. Place CLEAN SHEETS on your bed the night of your first shower and DO NOT SLEEP WITH PETS.  Day of Surgery: Do not apply any deodorants/lotions. Please wear clean clothes to the hospital/surgery center.    Do not wear jewelry, make-up or nail polish.  Do not wear lotions, powders, or perfumes, or deodorant.   Men may shave face and neck.  Do not bring valuables to the hospital.  [facility] is not responsible  for any belongings or valuables.  Contacts, dentures or bridgework may not be worn into surgery.  Leave your suitcase in the car.  After surgery it may be brought to your room.  For patients admitted to the hospital, discharge time will be determined by your treatment team.  Patients discharged the day of surgery will not be allowed to drive home.   Name and phone number of your driver: -  Special instructions:  -  Please read over the following fact sheets that you were given. [place]- Preparing For Surgery and Patient Instructions for Mupirocin Application, Coughing and Deep Breathing Pain Booklet

## 2016-02-21 ENCOUNTER — Encounter (HOSPITAL_COMMUNITY)
Admission: RE | Admit: 2016-02-21 | Discharge: 2016-02-21 | Disposition: A | Discharge status: Home / Self Care | Payer: Medicare HMO | Source: Ambulatory Visit | Attending: Neurosurgery | Admitting: Neurosurgery

## 2016-02-21 ENCOUNTER — Encounter (HOSPITAL_COMMUNITY): Payer: Self-pay

## 2016-02-21 DIAGNOSIS — E119 Type 2 diabetes mellitus without complications: Secondary | ICD-10-CM | POA: Diagnosis not present

## 2016-02-21 DIAGNOSIS — Z01812 Encounter for preprocedural laboratory examination: Secondary | ICD-10-CM | POA: Insufficient documentation

## 2016-02-21 DIAGNOSIS — Z01810 Encounter for preprocedural cardiovascular examination: Secondary | ICD-10-CM | POA: Diagnosis present

## 2016-02-21 DIAGNOSIS — I10 Essential (primary) hypertension: Secondary | ICD-10-CM | POA: Diagnosis not present

## 2016-02-21 LAB — BASIC METABOLIC PANEL
Anion gap: 10 (ref 5–15)
BUN: 13 mg/dL (ref 6–20)
CALCIUM: 9.6 mg/dL (ref 8.9–10.3)
CO2: 24 mmol/L (ref 22–32)
CREATININE: 0.86 mg/dL (ref 0.44–1.00)
Chloride: 106 mmol/L (ref 101–111)
GFR calc non Af Amer: >60 mL/min (ref >60)
Glucose, Bld: 169 mg/dL — ABNORMAL HIGH (ref 65–99)
Potassium: 3.9 mmol/L (ref 3.5–5.1)
SODIUM: 140 mmol/L (ref 135–145)

## 2016-02-21 LAB — ABO/RH: ABO/RH(D): A NEG

## 2016-02-21 LAB — TYPE AND SCREEN
ABO/RH(D): A NEG
ANTIBODY SCREEN: NEGATIVE

## 2016-02-21 LAB — CBC WITH DIFFERENTIAL/PLATELET
BASOS ABS: 0 10*3/uL (ref 0.0–0.1)
BASOS PCT: 0 %
Eosinophils Absolute: 0.3 10*3/uL (ref 0.0–0.7)
Eosinophils Relative: 2 %
HEMATOCRIT: 45.3 % (ref 36.0–46.0)
HEMOGLOBIN: 14.8 g/dL (ref 12.0–15.0)
Lymphocytes Relative: 27 %
Lymphs Abs: 2.9 10*3/uL (ref 0.7–4.0)
MCH: 30.5 pg (ref 26.0–34.0)
MCHC: 32.7 g/dL (ref 30.0–36.0)
MCV: 93.2 fL (ref 78.0–100.0)
Monocytes Absolute: 0.7 10*3/uL (ref 0.1–1.0)
Monocytes Relative: 7 %
NEUTROS ABS: 6.9 10*3/uL (ref 1.7–7.7)
NEUTROS PCT: 64 %
Platelets: 236 10*3/uL (ref 150–400)
RBC: 4.86 MIL/uL (ref 3.87–5.11)
RDW: 14.5 % (ref 11.5–15.5)
WBC: 10.9 10*3/uL — AB (ref 4.0–10.5)

## 2016-02-21 LAB — SURGICAL PCR SCREEN
MRSA, PCR: NEGATIVE
STAPHYLOCOCCUS AUREUS: NEGATIVE

## 2016-02-21 NOTE — Progress Notes (Signed)
PC[facility] - [facility] - Dr. Lawerance BachBurns Cardiologist - denies  Chest x-ray - not needed  EKG - 02/21/16 Stress Test - denies ECHO - denies Cardiac Cath - denies  Patient is prediabetic so checked an A1C just to make sure her level is within normal range    Patient denies shortness of breath, fever, cough and chest pain at PAT appointment

## 2016-02-22 LAB — HEMOGLOBIN A1C
HEMOGLOBIN A1C: 6.4 % — AB (ref 4.8–5.6)
MEAN PLASMA GLUCOSE: 137 mg/dL

## 2016-02-25 ENCOUNTER — Inpatient Hospital Stay (HOSPITAL_COMMUNITY): Payer: Medicare HMO | Admitting: Certified Registered Nurse Anesthetist

## 2016-02-25 ENCOUNTER — Inpatient Hospital Stay (HOSPITAL_COMMUNITY)
Admission: RE | Admit: 2016-02-25 | Discharge: 2016-02-27 | DRG: 460 | Disposition: A | Discharge status: Home / Self Care | Payer: Medicare HMO | Source: Ambulatory Visit | Attending: Neurosurgery | Admitting: Neurosurgery

## 2016-02-25 ENCOUNTER — Inpatient Hospital Stay (HOSPITAL_COMMUNITY): Payer: Medicare HMO

## 2016-02-25 ENCOUNTER — Encounter (HOSPITAL_COMMUNITY): Payer: Self-pay | Admitting: Certified Registered Nurse Anesthetist

## 2016-02-25 ENCOUNTER — Encounter (HOSPITAL_COMMUNITY)
Admission: RE | Disposition: A | Discharge status: Home / Self Care | Payer: Self-pay | Source: Ambulatory Visit | Attending: Neurosurgery

## 2016-02-25 DIAGNOSIS — E669 Obesity, unspecified: Secondary | ICD-10-CM | POA: Diagnosis present

## 2016-02-25 DIAGNOSIS — M4316 Spondylolisthesis, lumbar region: Principal | ICD-10-CM | POA: Diagnosis present

## 2016-02-25 DIAGNOSIS — M5116 Intervertebral disc disorders with radiculopathy, lumbar region: Secondary | ICD-10-CM | POA: Diagnosis present

## 2016-02-25 DIAGNOSIS — Z6838 Body mass index (BMI) 38.0-38.9, adult: Secondary | ICD-10-CM | POA: Diagnosis not present

## 2016-02-25 DIAGNOSIS — M4310 Spondylolisthesis, site unspecified: Secondary | ICD-10-CM | POA: Diagnosis present

## 2016-02-25 DIAGNOSIS — M4800 Spinal stenosis, site unspecified: Secondary | ICD-10-CM | POA: Diagnosis present

## 2016-02-25 DIAGNOSIS — R7303 Prediabetes: Secondary | ICD-10-CM | POA: Diagnosis present

## 2016-02-25 DIAGNOSIS — I10 Essential (primary) hypertension: Secondary | ICD-10-CM | POA: Diagnosis present

## 2016-02-25 DIAGNOSIS — M549 Dorsalgia, unspecified: Secondary | ICD-10-CM | POA: Diagnosis present

## 2016-02-25 LAB — GLUCOSE, CAPILLARY: GLUCOSE-CAPILLARY: 121 mg/dL — AB (ref 65–99)

## 2016-02-25 SURGERY — POSTERIOR LUMBAR FUSION 1 LEVEL
Anesthesia: General | Site: Back

## 2016-02-25 MED ORDER — SUGAMMADEX SODIUM 200 MG/2ML IV SOLN
INTRAVENOUS | Status: DC | PRN
  Administered 2016-02-25: 200 mg via INTRAVENOUS

## 2016-02-25 MED ORDER — THROMBIN 20000 UNITS EX SOLR
CUTANEOUS | Status: AC
  Filled 2016-02-25: qty 20000

## 2016-02-25 MED ORDER — LISINOPRIL 20 MG PO TABS
20.0000 mg | ORAL_TABLET | Freq: Every day | ORAL | Status: DC
  Administered 2016-02-26: 20 mg via ORAL
  Filled 2016-02-25: qty 1

## 2016-02-25 MED ORDER — PROPOFOL 10 MG/ML IV BOLUS
INTRAVENOUS | Status: AC
  Filled 2016-02-25: qty 40

## 2016-02-25 MED ORDER — PROPOFOL 10 MG/ML IV BOLUS
INTRAVENOUS | Status: DC | PRN
  Administered 2016-02-25: 50 mg via INTRAVENOUS
  Administered 2016-02-25: 150 mg via INTRAVENOUS

## 2016-02-25 MED ORDER — PHENYLEPHRINE 40 MCG/ML (10ML) SYRINGE FOR IV PUSH (FOR BLOOD PRESSURE SUPPORT)
PREFILLED_SYRINGE | INTRAVENOUS | Status: AC
  Filled 2016-02-25: qty 10

## 2016-02-25 MED ORDER — MIDAZOLAM HCL 2 MG/2ML IJ SOLN
INTRAMUSCULAR | Status: AC
  Filled 2016-02-25: qty 2

## 2016-02-25 MED ORDER — POLYETHYLENE GLYCOL 3350 17 GM/SCOOP PO POWD
17.0000 g | Freq: Every day | ORAL | Status: DC | PRN
  Filled 2016-02-25: qty 255

## 2016-02-25 MED ORDER — POLYETHYLENE GLYCOL 3350 17 G PO PACK: 17.0000 g | PACK | Freq: Every day | ORAL | Status: DC | PRN

## 2016-02-25 MED ORDER — BUPIVACAINE HCL (PF) 0.25 % IJ SOLN
INTRAMUSCULAR | Status: DC | PRN
  Administered 2016-02-25: 20 mL

## 2016-02-25 MED ORDER — HYDROCODONE-ACETAMINOPHEN 5-325 MG PO TABS: 1.0000 | ORAL_TABLET | ORAL | Status: DC | PRN

## 2016-02-25 MED ORDER — ACETAMINOPHEN 650 MG RE SUPP: 650.0000 mg | RECTAL | Status: DC | PRN

## 2016-02-25 MED ORDER — FENTANYL CITRATE (PF) 100 MCG/2ML IJ SOLN
INTRAMUSCULAR | Status: AC
  Administered 2016-02-25: 50 ug via INTRAVENOUS
  Filled 2016-02-25: qty 2

## 2016-02-25 MED ORDER — FENTANYL CITRATE (PF) 100 MCG/2ML IJ SOLN
25.0000 ug | INTRAMUSCULAR | Status: DC | PRN
  Administered 2016-02-25: 50 ug via INTRAVENOUS

## 2016-02-25 MED ORDER — LIDOCAINE 2% (20 MG/ML) 5 ML SYRINGE
INTRAMUSCULAR | Status: AC
  Filled 2016-02-25: qty 5

## 2016-02-25 MED ORDER — ROCURONIUM BROMIDE 10 MG/ML (PF) SYRINGE
PREFILLED_SYRINGE | INTRAVENOUS | Status: DC | PRN
  Administered 2016-02-25: 10 mg via INTRAVENOUS
  Administered 2016-02-25: 40 mg via INTRAVENOUS

## 2016-02-25 MED ORDER — SUGAMMADEX SODIUM 200 MG/2ML IV SOLN
INTRAVENOUS | Status: AC
  Filled 2016-02-25: qty 2

## 2016-02-25 MED ORDER — GLUCOSAMINE-CHONDROITIN 500-400 MG PO TABS: 1.0000 | ORAL_TABLET | Freq: Every day | ORAL | Status: DC

## 2016-02-25 MED ORDER — HYDROMORPHONE HCL 1 MG/ML IJ SOLN
0.5000 mg | INTRAMUSCULAR | Status: DC | PRN
  Administered 2016-02-25: 1 mg via INTRAVENOUS
  Filled 2016-02-25: qty 1

## 2016-02-25 MED ORDER — ROCURONIUM BROMIDE 10 MG/ML (PF) SYRINGE
PREFILLED_SYRINGE | INTRAVENOUS | Status: AC
  Filled 2016-02-25: qty 10

## 2016-02-25 MED ORDER — SODIUM CHLORIDE 0.9% FLUSH
3.0000 mL | Freq: Two times a day (BID) | INTRAVENOUS | Status: DC
  Administered 2016-02-25 (×2): 3 mL via INTRAVENOUS

## 2016-02-25 MED ORDER — PRAVASTATIN SODIUM 40 MG PO TABS
40.0000 mg | ORAL_TABLET | Freq: Every day | ORAL | Status: DC
  Administered 2016-02-25 – 2016-02-26 (×2): 40 mg via ORAL
  Filled 2016-02-25 (×2): qty 1

## 2016-02-25 MED ORDER — BUPIVACAINE HCL (PF) 0.25 % IJ SOLN
INTRAMUSCULAR | Status: AC
  Filled 2016-02-25: qty 30

## 2016-02-25 MED ORDER — GABAPENTIN 300 MG PO CAPS
300.0000 mg | ORAL_CAPSULE | Freq: Three times a day (TID) | ORAL | Status: DC
  Administered 2016-02-25 – 2016-02-26 (×5): 300 mg via ORAL
  Filled 2016-02-25 (×5): qty 1

## 2016-02-25 MED ORDER — MENTHOL 3 MG MT LOZG: 1.0000 | LOZENGE | OROMUCOSAL | Status: DC | PRN

## 2016-02-25 MED ORDER — DEXAMETHASONE SODIUM PHOSPHATE 10 MG/ML IJ SOLN
10.0000 mg | INTRAMUSCULAR | Status: AC
  Administered 2016-02-25: 10 mg via INTRAVENOUS
  Filled 2016-02-25: qty 1

## 2016-02-25 MED ORDER — LACTATED RINGERS IV SOLN
INTRAVENOUS | Status: DC | PRN
  Administered 2016-02-25 (×2): via INTRAVENOUS

## 2016-02-25 MED ORDER — DIAZEPAM 5 MG PO TABS
5.0000 mg | ORAL_TABLET | Freq: Four times a day (QID) | ORAL | Status: DC | PRN
  Administered 2016-02-25 – 2016-02-26 (×4): 5 mg via ORAL
  Filled 2016-02-25 (×3): qty 1

## 2016-02-25 MED ORDER — FENTANYL CITRATE (PF) 100 MCG/2ML IJ SOLN
INTRAMUSCULAR | Status: DC | PRN
  Administered 2016-02-25: 100 ug via INTRAVENOUS
  Administered 2016-02-25: 50 ug via INTRAVENOUS

## 2016-02-25 MED ORDER — CHLORHEXIDINE GLUCONATE CLOTH 2 % EX PADS: 6.0000 | MEDICATED_PAD | Freq: Once | CUTANEOUS | Status: DC

## 2016-02-25 MED ORDER — ONDANSETRON HCL 4 MG/2ML IJ SOLN
INTRAMUSCULAR | Status: DC | PRN
  Administered 2016-02-25: 4 mg via INTRAVENOUS

## 2016-02-25 MED ORDER — OXYCODONE-ACETAMINOPHEN 5-325 MG PO TABS
1.0000 | ORAL_TABLET | ORAL | Status: DC | PRN
  Administered 2016-02-25 – 2016-02-27 (×10): 2 via ORAL
  Filled 2016-02-25 (×9): qty 2

## 2016-02-25 MED ORDER — CEFAZOLIN IN D5W 1 GM/50ML IV SOLN
1.0000 g | Freq: Three times a day (TID) | INTRAVENOUS | Status: AC
  Administered 2016-02-25 – 2016-02-26 (×2): 1 g via INTRAVENOUS
  Filled 2016-02-25 (×2): qty 50

## 2016-02-25 MED ORDER — PROMETHAZINE HCL 25 MG/ML IJ SOLN: 6.2500 mg | INTRAMUSCULAR | Status: DC | PRN

## 2016-02-25 MED ORDER — PHENOL 1.4 % MT LIQD
1.0000 | OROMUCOSAL | Status: DC | PRN
Start: 2016-02-25 — End: 2016-02-27

## 2016-02-25 MED ORDER — OXYCODONE HCL 5 MG/5ML PO SOLN: 5.0000 mg | Freq: Once | ORAL | Status: DC | PRN

## 2016-02-25 MED ORDER — ONDANSETRON HCL 4 MG/2ML IJ SOLN
4.0000 mg | INTRAMUSCULAR | Status: DC | PRN
  Administered 2016-02-25: 4 mg via INTRAVENOUS
  Filled 2016-02-25: qty 2

## 2016-02-25 MED ORDER — PHENYLEPHRINE HCL 10 MG/ML IJ SOLN
INTRAMUSCULAR | Status: DC | PRN
  Administered 2016-02-25: 10 ug/min via INTRAVENOUS

## 2016-02-25 MED ORDER — ARTIFICIAL TEARS OP OINT
TOPICAL_OINTMENT | OPHTHALMIC | Status: DC | PRN
  Administered 2016-02-25: 1 via OPHTHALMIC

## 2016-02-25 MED ORDER — MIDAZOLAM HCL 5 MG/5ML IJ SOLN
INTRAMUSCULAR | Status: DC | PRN
  Administered 2016-02-25: 2 mg via INTRAVENOUS

## 2016-02-25 MED ORDER — VANCOMYCIN HCL 1000 MG IV SOLR
INTRAVENOUS | Status: DC | PRN
  Administered 2016-02-25: 1000 mg via TOPICAL

## 2016-02-25 MED ORDER — OXYCODONE HCL 5 MG PO TABS
ORAL_TABLET | ORAL | Status: AC
  Filled 2016-02-25: qty 1

## 2016-02-25 MED ORDER — OXYCODONE HCL 5 MG PO TABS: 5.0000 mg | ORAL_TABLET | Freq: Once | ORAL | Status: DC | PRN

## 2016-02-25 MED ORDER — THROMBIN 20000 UNITS EX SOLR
CUTANEOUS | Status: DC | PRN
  Administered 2016-02-25: 11:00:00 via TOPICAL

## 2016-02-25 MED ORDER — CEFAZOLIN SODIUM-DEXTROSE 2-4 GM/100ML-% IV SOLN
2.0000 g | INTRAVENOUS | Status: AC
  Administered 2016-02-25: 2 g via INTRAVENOUS
  Filled 2016-02-25: qty 100

## 2016-02-25 MED ORDER — VANCOMYCIN HCL 1000 MG IV SOLR
INTRAVENOUS | Status: AC
  Filled 2016-02-25: qty 1000

## 2016-02-25 MED ORDER — OXYCODONE-ACETAMINOPHEN 5-325 MG PO TABS
ORAL_TABLET | ORAL | Status: AC
  Administered 2016-02-25: 2 via ORAL
  Filled 2016-02-25: qty 2

## 2016-02-25 MED ORDER — SODIUM CHLORIDE 0.9% FLUSH: 3.0000 mL | INTRAVENOUS | Status: DC | PRN

## 2016-02-25 MED ORDER — PHENYLEPHRINE HCL 10 MG/ML IJ SOLN
INTRAMUSCULAR | Status: DC | PRN
  Administered 2016-02-25: 80 ug via INTRAVENOUS
  Administered 2016-02-25 (×3): 40 ug via INTRAVENOUS

## 2016-02-25 MED ORDER — 0.9 % SODIUM CHLORIDE (POUR BTL) OPTIME
TOPICAL | Status: DC | PRN
  Administered 2016-02-25: 1000 mL

## 2016-02-25 MED ORDER — ACETAMINOPHEN 325 MG PO TABS: 650.0000 mg | ORAL_TABLET | ORAL | Status: DC | PRN

## 2016-02-25 MED ORDER — LIDOCAINE 2% (20 MG/ML) 5 ML SYRINGE
INTRAMUSCULAR | Status: DC | PRN
  Administered 2016-02-25: 100 mg via INTRAVENOUS

## 2016-02-25 MED ORDER — SODIUM CHLORIDE 0.9 % IR SOLN
Status: DC | PRN
  Administered 2016-02-25: 10:00:00

## 2016-02-25 MED ORDER — FENTANYL CITRATE (PF) 100 MCG/2ML IJ SOLN
INTRAMUSCULAR | Status: AC
  Filled 2016-02-25: qty 4

## 2016-02-25 MED ORDER — DIAZEPAM 5 MG PO TABS
ORAL_TABLET | ORAL | Status: AC
  Administered 2016-02-25: 5 mg via ORAL
  Filled 2016-02-25: qty 1

## 2016-02-25 MED FILL — Sodium Chloride IV Soln 0.9%: INTRAVENOUS | Qty: 1000 | Status: AC

## 2016-02-25 MED FILL — Heparin Sodium (Porcine) Inj 1000 Unit/ML: INTRAMUSCULAR | Qty: 30 | Status: AC

## 2016-02-25 SURGICAL SUPPLY — 64 items
BAG DECANTER FOR FLEXI CONT (MISCELLANEOUS) ×3 IMPLANT
BENZOIN TINCTURE PRP APPL 2/3 (GAUZE/BANDAGES/DRESSINGS) ×3 IMPLANT
BLADE CLIPPER SURG (BLADE) IMPLANT
BUR CUTTER 7.0 ROUND (BURR) IMPLANT
BUR MATCHSTICK NEURO 3.0 LAGG (BURR) ×3 IMPLANT
CANISTER SUCT 3000ML PPV (MISCELLANEOUS) ×3 IMPLANT
CAP LCK SPNE (Orthopedic Implant) ×4 IMPLANT
CAP LOCK SPINE RADIUS (Orthopedic Implant) ×4 IMPLANT
CAP LOCKING (Orthopedic Implant) ×8 IMPLANT
CLOSURE STERI-STRIP 1/2X4 (GAUZE/BANDAGES/DRESSINGS) ×1
CLOSURE WOUND 1/2 X4 (GAUZE/BANDAGES/DRESSINGS) ×2
CLSR STERI-STRIP ANTIMIC 1/2X4 (GAUZE/BANDAGES/DRESSINGS) ×2 IMPLANT
CONT SPEC 4OZ CLIKSEAL STRL BL (MISCELLANEOUS) ×3 IMPLANT
COVER BACK TABLE 60X90IN (DRAPES) ×3 IMPLANT
DECANTER SPIKE VIAL GLASS SM (MISCELLANEOUS) ×3 IMPLANT
DERMABOND ADVANCED (GAUZE/BANDAGES/DRESSINGS) ×2
DERMABOND ADVANCED .7 DNX12 (GAUZE/BANDAGES/DRESSINGS) ×1 IMPLANT
DEVICE INTERBODY ELEVATE 9X23 (Cage) ×6 IMPLANT
DRAPE C-ARM 42X72 X-RAY (DRAPES) ×6 IMPLANT
DRAPE HALF SHEET 40X57 (DRAPES) IMPLANT
DRAPE LAPAROTOMY 100X72X124 (DRAPES) ×3 IMPLANT
DRAPE POUCH INSTRU U-SHP 10X18 (DRAPES) ×3 IMPLANT
DRAPE SURG 17X23 STRL (DRAPES) ×12 IMPLANT
DRSG OPSITE POSTOP 4X6 (GAUZE/BANDAGES/DRESSINGS) ×3 IMPLANT
DURAPREP 26ML APPLICATOR (WOUND CARE) ×3 IMPLANT
ELECT REM PT RETURN 9FT ADLT (ELECTROSURGICAL) ×3
ELECTRODE REM PT RTRN 9FT ADLT (ELECTROSURGICAL) ×1 IMPLANT
EVACUATOR 1/8 PVC DRAIN (DRAIN) ×3 IMPLANT
GAUZE SPONGE 4X4 12PLY STRL (GAUZE/BANDAGES/DRESSINGS) ×3 IMPLANT
GAUZE SPONGE 4X4 16PLY XRAY LF (GAUZE/BANDAGES/DRESSINGS) IMPLANT
GLOVE BIOGEL PI IND STRL 6.5 (GLOVE) ×2 IMPLANT
GLOVE BIOGEL PI INDICATOR 6.5 (GLOVE) ×4
GLOVE ECLIPSE 7.0 STRL STRAW (GLOVE) ×3 IMPLANT
GLOVE ECLIPSE 9.0 STRL (GLOVE) ×6 IMPLANT
GLOVE EXAM NITRILE LRG STRL (GLOVE) IMPLANT
GLOVE EXAM NITRILE XL STR (GLOVE) IMPLANT
GLOVE EXAM NITRILE XS STR PU (GLOVE) IMPLANT
GLOVE INDICATOR 7.0 STRL GRN (GLOVE) ×3 IMPLANT
GLOVE INDICATOR 7.5 STRL GRN (GLOVE) ×3 IMPLANT
GLOVE SURG SS PI 6.5 STRL IVOR (GLOVE) ×6 IMPLANT
GOWN STRL REUS W/ TWL LRG LVL3 (GOWN DISPOSABLE) ×2 IMPLANT
GOWN STRL REUS W/ TWL XL LVL3 (GOWN DISPOSABLE) ×2 IMPLANT
GOWN STRL REUS W/TWL 2XL LVL3 (GOWN DISPOSABLE) IMPLANT
GOWN STRL REUS W/TWL LRG LVL3 (GOWN DISPOSABLE) ×4
GOWN STRL REUS W/TWL XL LVL3 (GOWN DISPOSABLE) ×4
KIT BASIN OR (CUSTOM PROCEDURE TRAY) ×3 IMPLANT
KIT ROOM TURNOVER OR (KITS) ×3 IMPLANT
NEEDLE HYPO 22GX1.5 SAFETY (NEEDLE) ×3 IMPLANT
NS IRRIG 1000ML POUR BTL (IV SOLUTION) ×3 IMPLANT
PACK LAMINECTOMY NEURO (CUSTOM PROCEDURE TRAY) ×3 IMPLANT
ROD RADIUS 40MM (Neuro Prosthesis/Implant) ×4 IMPLANT
ROD SPNL 40X5.5XNS TI RDS (Neuro Prosthesis/Implant) ×2 IMPLANT
SCREW 5.75X40M (Screw) ×6 IMPLANT
SCREW 5.75X45MM (Screw) ×6 IMPLANT
SPONGE SURGIFOAM ABS GEL 100 (HEMOSTASIS) ×3 IMPLANT
STRIP CLOSURE SKIN 1/2X4 (GAUZE/BANDAGES/DRESSINGS) ×4 IMPLANT
SUT VIC AB 0 CT1 18XCR BRD8 (SUTURE) ×2 IMPLANT
SUT VIC AB 0 CT1 8-18 (SUTURE) ×4
SUT VIC AB 2-0 CT1 18 (SUTURE) ×6 IMPLANT
SUT VIC AB 3-0 SH 8-18 (SUTURE) ×6 IMPLANT
TOWEL OR 17X24 6PK STRL BLUE (TOWEL DISPOSABLE) ×3 IMPLANT
TOWEL OR 17X26 10 PK STRL BLUE (TOWEL DISPOSABLE) ×3 IMPLANT
TRAY FOLEY W/METER SILVER 16FR (SET/KITS/TRAYS/PACK) ×3 IMPLANT
WATER STERILE IRR 1000ML POUR (IV SOLUTION) ×3 IMPLANT

## 2016-02-25 NOTE — Brief Op Note (Signed)
02/25/2016  11:46 AM  PATIENT:  Krista Anderson  70 y.o. female  PRE-OPERATIVE DIAGNOSIS:  Degenerative spondylolisthesis  POST-OPERATIVE DIAGNOSIS:  Degenerative spondylolisthesis  PROCEDURE:  Procedure(s) with comments: POSTERIOR LUMBAR INTERBODY FUSION - LUMBAR THREE - LUMBAR FOUR (N/A) - POSTERIOR LUMBAR INTERBODY FUSION - LUMBAR THREE - LUMBAR FOUR  SURGEON:  Surgeon(s) and Role:    * Julio SicksHenry Nabilah Davoli, MD - Primary  PHYSICIAN ASSISTANT:   ASSISTANTS: Nundkumar   ANESTHESIA:   general  EBL:  Total I/O In: 1400 [I.V.:1400] Out: 300 [Urine:150; Blood:150]  BLOOD ADMINISTERED:none  DRAINS: none   LOCAL MEDICATIONS USED:  MARCAINE     SPECIMEN:  No Specimen  DISPOSITION OF SPECIMEN:  N/A  COUNTS:  YES  TOURNIQUET:  * No tourniquets in log *  DICTATION: .Dragon Dictation  PLAN OF CARE: Admit to inpatient   PATIENT DISPOSITION:  PACU - hemodynamically stable.   Delay start of Pharmacological VTE agent (>24hrs) due to surgical blood loss or risk of bleeding: yes

## 2016-02-25 NOTE — Transfer of Care (Signed)
Immediate Anesthesia Transfer of Care Note  Patient: Krista Anderson  Procedure(s) Performed: Procedure(s) with comments: POSTERIOR LUMBAR INTERBODY FUSION - LUMBAR THREE - LUMBAR FOUR (N/A) - POSTERIOR LUMBAR INTERBODY FUSION - LUMBAR THREE - LUMBAR FOUR  Patient Location: PACU  Anesthesia Type:General  Level of Consciousness: awake, alert  and oriented  Airway & Oxygen Therapy: Patient Spontanous Breathing  Post-op Assessment: Report given to RN, Post -op Vital signs reviewed and stable and Patient moving all extremities X 4  Post vital signs: Reviewed and stable  Last Vitals:  Vitals:   02/25/16 0857  BP: (!) 155/52  Pulse: 72  Resp: 18  Temp: 36.6 C    Last Pain:  Vitals:   02/25/16 0857  TempSrc: Oral         Complications: No apparent anesthesia complications

## 2016-02-25 NOTE — Anesthesia Procedure Notes (Signed)
Procedure Name: Intubation Date/Time: 02/25/2016 9:45 AM Performed by: Rise PatienceBELL, Janiyah Beery T Pre-anesthesia Checklist: Patient identified, Emergency Drugs available, Suction available and Patient being monitored Patient Re-evaluated:Patient Re-evaluated prior to inductionOxygen Delivery Method: Circle System Utilized Preoxygenation: Pre-oxygenation with 100% oxygen Intubation Type: IV induction Ventilation: Mask ventilation without difficulty and Oral airway inserted - appropriate to patient size Laryngoscope Size: Miller Grade View: Grade I Tube type: Oral Tube size: 7.5 mm Number of attempts: 1 Airway Equipment and Method: Stylet and Oral airway Placement Confirmation: ETT inserted through vocal cords under direct vision,  positive ETCO2 and breath sounds checked- equal and bilateral Secured at: 21 cm Tube secured with: Tape Dental Injury: Teeth and Oropharynx as per pre-operative assessment

## 2016-02-25 NOTE — Anesthesia Preprocedure Evaluation (Addendum)
Anesthesia Evaluation  Patient identified by MRN, date of birth, ID band Patient awake    Reviewed: Allergy & Precautions, NPO status , Patient's Chart, lab work & pertinent test results  Airway Mallampati: III  TM Distance: >3 FB Neck ROM: Full    Dental  (+) Teeth Intact, Dental Advisory Given   Pulmonary neg pulmonary ROS,    Pulmonary exam normal        Cardiovascular hypertension, Pt. on medications negative cardio ROS Normal cardiovascular exam     Neuro/Psych negative neurological ROS  negative psych ROS   GI/Hepatic negative GI ROS, Neg liver ROS,   Endo/Other  diabetes (pre-diabetes)  Renal/GU negative Renal ROS  negative genitourinary   Musculoskeletal negative musculoskeletal ROS (+)   Abdominal (+) + obese,   Peds negative pediatric ROS (+)  Hematology negative hematology ROS (+)   Anesthesia Other Findings   Reproductive/Obstetrics negative OB ROS                           Anesthesia Physical Anesthesia Plan  ASA: III  Anesthesia Plan: General   Post-op Pain Management:    Induction: Intravenous  Airway Management Planned: Oral ETT  Additional Equipment:   Intra-op Plan:   Post-operative Plan: Extubation in OR  Informed Consent: I have reviewed the patients History and Physical, chart, labs and discussed the procedure including the risks, benefits and alternatives for the proposed anesthesia with the patient or authorized representative who has indicated his/her understanding and acceptance.   Dental advisory given  Plan Discussed with: CRNA  Anesthesia Plan Comments:        Anesthesia Quick Evaluation

## 2016-02-25 NOTE — Op Note (Signed)
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  L3-4 posterior lumbar interbody fusion utilizing interbody expandable cages and local autograft  L3-4 posterior lateral arthrodesis utilizing nonsegmental pedicle screw fixation and local autograft  Surgeon:Searra Carnathan A.Alexia Dinger, M.D.  Asst. Surgeon: Conchita ParisNundkumar  Anesthesia: General  Indication: 70 year old female with severe back and right lower chamois pain failing conservative management. Workup demonstrates evidence of a mobile L3-L4 degenerative spondylolisthesis with associated X foraminal disc herniation on the right side. Patient has failed conservative management. She presents now for decompression infusion in hopes of improving her symptoms.  Operative note: After induction of anesthesia, patient position prone onto Wilson frame and a properly padded. Lumbar region prepped and draped sterilely. Incision made overlying L3-4. Dissection performed bilaterally. Retractor placed. Fluoroscopy used. Levels confirmed. Decompressive laminotomies and foraminotomies were performed using high-speed drill and Kerrison rongeurs and Leksell rongeurs to remove the inferior two thirds the lamina of L3 the inferior facets of L3 bilaterally the majority the superior facets of L4 bilaterally and the superior rim of the L4 lamina. Ligament flavum was elevated and resected piecemeal fashion. Underlying thecal sac next thing L3 and L4 nerve roots were then verified. Wide decompressive foraminotomies were completed along the course the nerve roots. Bilateral discectomies and then performed at L3-4. On the right side there was a large X foraminal herniation which was resected. At this  point a very thorough decompression been achieved. There was no evidence injury to thecal sac or nerve roots. Disc spaces and prepared for interbody fusion. With a distractor placed in the right side of disc space was cleaned of all soft tissue. A 9 mm lordotic Medtronic expandable cage packed with locally harvested autograft was then impacted into place and then expanded to its full extent. Distractor was removed patient's right side. Disc space was per the right side. Morselized autograft was packed into the interspace and then a second 9 mm cage was impacted into place and expanded to its full extent. Pedicles of L3 and L4 were notified using surface landmarks and intraoperative fluoroscopy. Superficial bone overlying the pedicle was removed using high-speed drill. Each pedicles and probed using a pedicle awl each pedicle awl track was then tapped and each screw tap hole was probed and found to be solidly within the bone. 5.75 mm radius brand screws from [employer] were placed bilaterally at L3 and L4 area final images were taken revealing good position of the screws and cages at the proper upper level with normal alignment is spine. Wounds and irrigated one final time. Transverse processes and residual facets were decorticated. Morselize autograft was packed posterior laterally. Short segment titanium rods and placed over the screw heads at L3 and L4. Locking caps and placed over the screws. Locking caps were then engaged with the construct under slight compression. Wound is then irrigated one final time and Gelfoam was placed topically for hemostasis. Vancomycin powder was left in the deep wound space. Wounds was then closed in layers. Steri-Strips and sterile dressing were applied. No apparent complications. Patient tolerated the procedure well and she returns to the recovery room postop.

## 2016-02-25 NOTE — H&P (Signed)
  Krista Anderson is an 70 y.o. female.   Chief Complaint: Back and right lower extremity pain. HPI: 70 year old female with chronic progressive right lower extremity pain predominately consistent with a right-sided L4 radiculopathy. Patient has failed conservative management. She has minimal left-sided symptoms. She has no bowel or bladder dysfunction. Workup demonstrates evidence of a mobile degenerative spondylolisthesis at L3-4 with foraminal stenosis. Patient presents now for decompression infusion at L3-4.  Past Medical History:  Diagnosis Date  . Borderline diabetes   . Hypertension     Past Surgical History:  Procedure Laterality Date  . ABDOMINAL HYSTERECTOMY    . APPENDECTOMY    . BACK SURGERY    . CARPAL TUNNEL RELEASE Bilateral   . CHOLECYSTECTOMY    . FRACTURE SURGERY     both wrists    History reviewed. No pertinent family history. Social History:  reports that she has never smoked. She has never used smokeless tobacco. She reports that she does not drink alcohol. Her drug history is not on file.  Allergies:  Allergies  Allergen Reactions  . Amitriptyline Other (See Comments)    Makes me crazy   . Sulfur Nausea Only    Sick on stomach     No prescriptions prior to admission.    No results found for this or any previous visit (from the past 48 hour(s)). No results found.  Patient is awake and alert. She is oriented and appropriate. She is fluent. Her judgment and insight are intact. Cranial nerve function intact bilaterally. Motor examination reveals intact motor strength in both upper and lower extremities. Sensory examination was decreased sensation to pinprick and light touch in her right L4 dermatome. Reflexes normal except right Achilles and left Achilles absent. Right patellar reflex slightly diminished. No evidence of Guadron track signs. Gait antalgic on the right. Examination head ears eyes and thirds unremarkable. Chest and abdomen are benign. Extremities are  free from injury deformity. There were no vitals taken for this visit.   Assessment/Plan  grade 1 L3-4 mobile degenerative spondylolisthesis with foraminal stenosis and radiculopathy. Plan bilateral L3-4 decompressive laminotomies and foraminotomies followed by posterior lumbar interbody fusion utilizing interbody expandable cages, locally harvested autograft, and augmented with posterior lateral arthrodesis utilizing nonsegmental pedicle screw fixation and local autograft. Risks and benefits of been explained. Patient wishes to proceed.  Krista Anderson A 02/25/2016, 7:54 AM

## 2016-02-25 NOTE — Anesthesia Postprocedure Evaluation (Signed)
Anesthesia Post Note  Patient: Krista Anderson  Procedure(s) Performed: Procedure(s) (LRB): POSTERIOR LUMBAR INTERBODY FUSION - LUMBAR THREE - LUMBAR FOUR (N/A)  Patient location during evaluation: PACU Anesthesia Type: General Level of consciousness: awake and alert Pain management: pain level controlled Vital Signs Assessment: post-procedure vital signs reviewed and stable Respiratory status: spontaneous breathing, nonlabored ventilation, respiratory function stable and patient connected to nasal cannula oxygen Cardiovascular status: blood pressure returned to baseline and stable Postop Assessment: no signs of nausea or vomiting Anesthetic complications: no    Last Vitals:  Vitals:   02/25/16 1320 02/25/16 1353  BP: (!) 144/79 139/61  Pulse: (!) 59 60  Resp: 17 16  Temp:  36.4 C    Last Pain:  Vitals:   02/25/16 1305  TempSrc:   PainSc: 4                  [name] Astrid DivineS Jesstin Studstill

## 2016-02-26 LAB — GLUCOSE, CAPILLARY: Glucose-Capillary: 129 mg/dL — ABNORMAL HIGH (ref 65–99)

## 2016-02-26 NOTE — Evaluation (Signed)
Physical Therapy Evaluation Patient Details Name: Concha SeFaye H Alspaugh MRN: [id number] DOB: October 30, 1945 Today's Date: 02/26/2016   History of Present Illness  70 yo female s/p L3-4 fusion  Clinical Impression  Pt admitted with above diagnosis. Pt currently with functional limitations due to the deficits listed below (see PT Problem List). At the time of PT eval pt was able to perform transfers and ambulation with min guard assist to supervision for safety. Recommended use of RW however pt declining use and states [facility] Pt will benefit from skilled PT to increase their independence and safety with mobility to allow discharge to the venue listed below.       Follow Up Recommendations Outpatient PT;Supervision for mobility/OOB    Equipment Recommendations  Rolling walker with 5" wheels;Cane    Recommendations for Other Services       Precautions / Restrictions Precautions Precautions: Back Precaution Comments: back handout provided and reviewed in detail Required Braces or Orthoses: Spinal Brace Spinal Brace: Lumbar corset;Applied in sitting position Restrictions Weight Bearing Restrictions: No      Mobility  Bed Mobility               General bed mobility comments: in chair on arrival  Transfers Overall transfer level: Modified independent Equipment used: Rolling walker (2 wheeled)             General transfer comment: VC's for hand placement on seated surface for safety.   Ambulation/Gait Ambulation/Gait assistance: Min guard;Supervision Ambulation Distance (Feet): 100 Feet Assistive device: Rolling walker (2 wheeled);Straight cane;None Gait Pattern/deviations: Step-through pattern;Decreased stride length;Trunk flexed Gait velocity: Decreased Gait velocity interpretation: Below normal speed for age/gender General Gait Details: Pt reports that she furniture walks mostly in the house and does not wish to use the RW. Advised using RW initially and  pt agreeable - requiring supervision for safety. [facility], pt requiring hands-on guarding for support as pt was unsteady and very guarded.   Stairs Stairs: Yes Stairs assistance: Min guard Stair Management: One rail Right;Step to pattern;Forwards Number of Stairs: 1 (x2) General stair comments: VC's for sequencing and general safety.   Wheelchair Mobility    Modified Rankin (Stroke Patients Only)       Balance Overall balance assessment: Needs assistance Sitting-balance support: Feet supported;No upper extremity supported Sitting balance-Leahy Scale: Fair     Standing balance support: During functional activity;No upper extremity supported Standing balance-Leahy Scale: Fair Standing balance comment: Statically                             Pertinent Vitals/Pain Pain Assessment: Faces Faces Pain Scale: Hurts little more Pain Location: Incision site Pain Descriptors / Indicators: Operative site guarding;Aching Pain Intervention(s): Limited activity within patient's tolerance;Monitored during session;Repositioned    Home Living Family/patient expects to be discharged to:: Private residence Living Arrangements: Children Available Help at Discharge: Family Type of Home: House Home Access: Stairs to enter Entrance Stairs-Rails: None Secretary/administratorntrance Stairs-Number of Steps: 1 Home Layout: One level Home Equipment: Grab bars - tub/shower;Hand held shower head      Prior Function Level of Independence: Independent               Hand Dominance   Dominant Hand: Right    Extremity/Trunk Assessment   Upper Extremity Assessment: Defer to OT evaluation           Lower Extremity Assessment: Generalized weakness  Cervical / Trunk Assessment: Other exceptions  Communication   Communication: No difficulties  Cognition Arousal/Alertness: Awake/alert Behavior During Therapy: WFL for tasks assessed/performed Overall Cognitive Status: Within  Functional Limits for tasks assessed                      General Comments      Exercises     Assessment/Plan    PT Assessment Patient needs continued PT services  PT Problem List Decreased strength;Decreased range of motion;Decreased activity tolerance;Decreased balance;Decreased mobility;Decreased knowledge of use of DME;Decreased safety awareness;Decreased knowledge of precautions;Pain          PT Treatment Interventions DME instruction;Gait training;Stair training;Functional mobility training;Therapeutic activities;Therapeutic exercise;Neuromuscular re-education;Patient/family education    PT Goals (Current goals can be found in the Care Plan section)  Acute Rehab PT Goals Patient Stated Goal: Return home tomorrow PT Goal Formulation: With patient Time For Goal Achievement: 03/04/16 Potential to Achieve Goals: Good    Frequency Min 5X/week   Barriers to discharge        Co-evaluation               End of Session Equipment Utilized During Treatment: Gait belt;Back brace Activity Tolerance: Patient limited by fatigue Patient left: in chair;with call bell/phone within reach Nurse Communication: Mobility status         Time: [id number] PT Time Calculation (min) (ACUTE ONLY): 21 min   Charges:   PT Evaluation $PT Eval Moderate Complexity: 1 Procedure     PT G Codes:        Conni SlipperKirkman, Letrell Attwood 02/26/2016, 10:46 AM   Conni SlipperLaura Hazely Sealey, PT, DPT Acute Rehabilitation Services Pager: [phone]

## 2016-02-26 NOTE — Progress Notes (Signed)
Postop day 1. Patient feels much better. Right lower extremity pain resolved. Standing and ambulating much better.  Vitals are stable. She is afebrile. Motor and sensory intact. Wound clean and dry. Chest and abdomen benign.  Progressing well following lumbar decompression and fusion. Patient desires discharge in morning.

## 2016-02-26 NOTE — Evaluation (Signed)
Occupational Therapy Evaluation Patient Details Name: Krista Anderson MRN: [id number] DOB: 1945-05-24 Today's Date: 02/26/2016    History of Present Illness 70 yo female s/p L3-4 fusion PMH  Past Medical History:  Diagnosis Date  . Borderline diabetes   . Hypertension       Clinical Impression   Patient evaluated by Occupational Therapy with no further acute OT needs identified. All education has been completed and the patient has no further questions. See below for any follow-up Occupational Therapy or equipment needs. OT to sign off. Thank you for referral.      Follow Up Recommendations  No OT follow up    Equipment Recommendations  Other (comment) (RW)    Recommendations for Other Services       Precautions / Restrictions Precautions Precautions: Back Precaution Comments: back handout provided and reviewed in detail Required Braces or Orthoses: Spinal Brace Spinal Brace: Lumbar corset;Applied in sitting position      Mobility Bed Mobility               General bed mobility comments: in chair on arrival  Transfers Overall transfer level: Modified independent                    Balance Overall balance assessment: Needs assistance         Standing balance support: During functional activity;Single extremity supported Standing balance-Leahy Scale: Fair                              ADL Overall ADL's : Modified independent           Back handout provided and reviewed adls in detail. Pt educated on: clothing between brace, never sleep in brace, set an alarm at night for medication, avoid sitting for [place] periods of time, correct bed positioning for sleeping, correct sequence for bed mobility, avoiding lifting more than 5 pounds and never wash directly over incision. All education is complete and patient indicates understanding.                               General ADL Comments: requried cues during session  completing ADl and able to complete sponge bath at sink level     Vision     Perception     Praxis      Pertinent Vitals/Pain Pain Assessment: Faces Faces Pain Scale: Hurts little more Pain Location: back Pain Descriptors / Indicators: Operative site guarding Pain Intervention(s): Monitored during session;Repositioned;Premedicated before session     Hand Dominance Right   Extremity/Trunk Assessment Upper Extremity Assessment Upper Extremity Assessment: Overall WFL for tasks assessed   Lower Extremity Assessment Lower Extremity Assessment: Defer to PT evaluation   Cervical / Trunk Assessment Cervical / Trunk Assessment: Other exceptions Cervical / Trunk Exceptions: s/p surg   Communication Communication Communication: No difficulties   Cognition Arousal/Alertness: Awake/alert Behavior During Therapy: WFL for tasks assessed/performed Overall Cognitive Status: Within Functional Limits for tasks assessed                     General Comments       Exercises       Shoulder Instructions      Home Living Family/patient expects to be discharged to:: Private residence Living Arrangements: Children Available Help at Discharge: Family Type of Home: House Home Access: Stairs to enter Enter[employer] CorporationEntrance Stairs-Number of Steps:  1 Entrance Stairs-Rails: None Home Layout: One level     Bathroom Shower/Tub: Chief Strategy OfficerTub/shower unit   Bathroom Toilet: Standard     Home Equipment: Grab bars - tub/shower;Hand held shower head          Prior Functioning/Environment Level of Independence: Independent                 OT Problem List:     OT Treatment/Interventions:      OT Goals(Current goals can be found in the care plan section) Acute Rehab OT Goals OT Goal Formulation: With patient  OT Frequency:     Barriers to D/C:            Co-evaluation              End of Session Equipment Utilized During Treatment: Gait belt;Rolling walker;Back brace Nurse  Communication: Mobility status;Precautions  Activity Tolerance: Patient tolerated treatment well Patient left: in chair;with call bell/phone within reach   Time: 0757-0829 OT Time Calculation (min): 32 min Charges:  OT General Charges $OT Visit: 1 Procedure OT Evaluation $OT Eval Moderate Complexity: 1 Procedure OT Treatments $Self Care/Home Management : 8-22 mins G-Codes:    Boone MasterJones, Natilie Krabbenhoft B 02/26/2016, 9:19 AM   Mateo FlowJones, Brynn   OTR/L Pager: [phone]: [phone] Office: [phone] .

## 2016-02-27 MED ORDER — DIAZEPAM 5 MG PO TABS: 5.0000 mg | ORAL_TABLET | Freq: Four times a day (QID) | ORAL | 0 refills | Status: DC | PRN

## 2016-02-27 MED ORDER — OXYCODONE-ACETAMINOPHEN 5-325 MG PO TABS: 1.0000 | ORAL_TABLET | ORAL | 0 refills | Status: DC | PRN

## 2016-02-27 NOTE — Discharge Instructions (Signed)

## 2016-02-27 NOTE — Discharge Summary (Signed)
2Physician Discharge Summary  Patient ID: Krista Anderson MRN: [id number] DOB/AGE: Nov 22, 1945 70 y.o.  Admit date: 02/25/2016 Discharge date: 02/27/2016  Admission Diagnoses:  Discharge Diagnoses:  Active Problems:   Degenerative spondylolisthesis   Discharged Condition: good  Hospital Course: Patient admitted to the hospital where she underwent an uncomplicated lumbar decompression and fusion. Awake and alert. Oriented and appropriate. Cranial nerve function intact. Motor and sensory function extremities normal. Wound clean and dry. Chest and abdomen benign. patient is done very well. Preoperative back and right lower extremity pain much improved. Standing and ambulate without difficulty. Ready for discharge home.  Consults:   Significant Diagnostic Studies:   Treatments:   Discharge Exam: Blood pressure (!) 148/63, pulse 80, temperature 97.7 F (36.5 C), temperature source Oral, resp. rate 20, height 5\' 4"  (1.626 m), weight 101.3 kg (223 lb 6.4 oz), SpO2 97 %.   Disposition: 01-Home or Self Care     Medication List    TAKE these medications   acetaminophen 500 MG tablet Commonly known as:  TYLENOL Take 500 mg by mouth every 6 (six) hours as needed for mild pain.   aspirin EC 81 MG tablet Take 81 mg by mouth daily. Will stop prior to procedure   diazepam 5 MG tablet Commonly known as:  VALIUM Take 1-2 tablets (5-10 mg total) by mouth every 6 (six) hours as needed for muscle spasms.   gabapentin 300 MG capsule Commonly known as:  NEURONTIN Take 300 mg by mouth 3 (three) times daily.   glucosamine-chondroitin 500-400 MG tablet Take 1 tablet by mouth daily. Has stopped prior to procedure   lisinopril 20 MG tablet Commonly known as:  PRINIVIL,ZESTRIL Take 20 mg by mouth daily.   naproxen 500 MG tablet Commonly known as:  NAPROSYN Take 1 tablet (500 mg total) by mouth 2 (two) times daily with a meal. What changed:  additional instructions    oxyCODONE-acetaminophen 5-325 MG tablet Commonly known as:  PERCOCET/ROXICET Take 1-2 tablets by mouth every 4 (four) hours as needed for moderate pain.   polyethylene glycol powder powder Commonly known as:  GLYCOLAX/MIRALAX Take 17 g by mouth daily as needed for constipation.   pravastatin 40 MG tablet Commonly known as:  PRAVACHOL Take 40 mg by mouth at bedtime.   VISION FORMULA PO Take 1 tablet by mouth daily.   VOLTAREN 1 % Gel Generic drug:  diclofenac sodium Apply 1 application topically daily as needed for pain.      Follow-up Information    Temple PaciniPOOL,Alison Kubicki A, MD .   Specialty:  Neurosurgery Contact information: [address] [place] [address] [phone]           Signed: Temple PaciniOOL,Dorothy Polhemus A 02/27/2016, 9:38 AM

## 2016-02-27 NOTE — Progress Notes (Signed)
Pt doing well. Pt given D/C instructions with Rx's, verbal understanding was provided. Pt's IV was removed prior to D/C. Pt's incision is clean and dry with no sign of infection. Pt D/C'd home via wheelchair @ 1030 per MD order. Pt is stable @ D/C and has no other needs at this time. Erion Hermans, RN  

## 2016-02-27 NOTE — Progress Notes (Signed)
Physical Therapy Treatment Patient Details Name: Concha SeFaye H  MRN: [id number] DOB: April 28, 1945 Today's Date: 02/27/2016    History of Present Illness 70 yo female s/p L3-4 fusion PMH    PT Comments    Pt progressing towards physical therapy goals. Was able to perform transfers and ambulation with gross min guard assist. Pt continues to appear that she does not know her precautions and requires cues to recall. Will continue to follow and progress as able per POC.   Follow Up Recommendations  Outpatient PT;Supervision for mobility/OOB     Equipment Recommendations  Rolling walker with 5" wheels;Cane    Recommendations for Other Services       Precautions / Restrictions Precautions Precautions: Back Precaution Comments: back handout provided and reviewed in detail Required Braces or Orthoses: Spinal Brace Spinal Brace: Lumbar corset;Applied in sitting position Restrictions Weight Bearing Restrictions: No    Mobility  Bed Mobility               General bed mobility comments: Pt sitting up on EOB eating breakfast when PT arrived.   Transfers Overall transfer level: Modified independent Equipment used: Rolling walker (2 wheeled)             General transfer comment: VC's for hand placement on seated surface for safety.   Ambulation/Gait Ambulation/Gait assistance: Min guard Ambulation Distance (Feet): 200 Feet Assistive device: Rolling walker (2 wheeled);Straight cane;None Gait Pattern/deviations: Step-through pattern;Decreased stride length;Narrow base of support Gait velocity: Decreased Gait velocity interpretation: Below normal speed for age/gender General Gait Details: VC's for general safety and maitenance of precautions.    Stairs            Wheelchair Mobility    Modified Rankin (Stroke Patients Only)       Balance Overall balance assessment: Needs assistance Sitting-balance support: Feet supported;No upper extremity supported Sitting  balance-Leahy Scale: Fair     Standing balance support: No upper extremity supported;During functional activity Standing balance-Leahy Scale: Fair                      Cognition Arousal/Alertness: Awake/alert Behavior During Therapy: WFL for tasks assessed/performed Overall Cognitive Status: Within Functional Limits for tasks assessed                      Exercises      General Comments        Pertinent Vitals/Pain Faces Pain Scale: Hurts little more    Home Living                      Prior Function            PT Goals (current goals can now be found in the care plan section) Acute Rehab PT Goals Patient Stated Goal: Return home tomorrow PT Goal Formulation: With patient Time For Goal Achievement: 03/04/16 Potential to Achieve Goals: Good Progress towards PT goals: Progressing toward goals    Frequency    Min 5X/week      PT Plan Current plan remains appropriate    Co-evaluation             End of Session Equipment Utilized During Treatment: Gait belt;Back brace Activity Tolerance: Patient limited by fatigue Patient left: in chair;with call bell/phone within reach     Time: 0907-0925 PT Time Calculation (min) (ACUTE ONLY): 18 min  Charges:  $Gait Training: 8-22 mins  G Codes:      Conni SlipperKirkman, Jaishon Krisher 02/27/2016, 10:11 AM   Conni SlipperLaura Emilija Bohman, PT, DPT Acute Rehabilitation Services Pager: [phone]

## 2016-05-06 ENCOUNTER — Other Ambulatory Visit: Payer: Self-pay | Admitting: Internal Medicine

## 2016-05-06 DIAGNOSIS — R928 Other abnormal and inconclusive findings on diagnostic imaging of breast: Secondary | ICD-10-CM

## 2016-05-26 ENCOUNTER — Ambulatory Visit
Admission: RE | Admit: 2016-05-26 | Discharge: 2016-05-26 | Disposition: A | Discharge status: Home / Self Care | Payer: Medicare HMO | Source: Ambulatory Visit | Attending: Internal Medicine | Admitting: Internal Medicine

## 2016-05-26 DIAGNOSIS — R921 Mammographic calcification found on diagnostic imaging of breast: Secondary | ICD-10-CM | POA: Diagnosis not present

## 2016-05-26 DIAGNOSIS — R928 Other abnormal and inconclusive findings on diagnostic imaging of breast: Secondary | ICD-10-CM

## 2018-02-04 ENCOUNTER — Other Ambulatory Visit: Payer: Self-pay | Admitting: Internal Medicine

## 2018-02-04 DIAGNOSIS — Z1231 Encounter for screening mammogram for malignant neoplasm of breast: Secondary | ICD-10-CM

## 2018-03-01 ENCOUNTER — Ambulatory Visit
Admission: RE | Admit: 2018-03-01 | Discharge: 2018-03-01 | Disposition: A | Discharge status: Home / Self Care | Payer: Medicare HMO | Source: Ambulatory Visit | Attending: Internal Medicine | Admitting: Internal Medicine

## 2018-03-01 DIAGNOSIS — Z1231 Encounter for screening mammogram for malignant neoplasm of breast: Secondary | ICD-10-CM

## 2018-09-29 ENCOUNTER — Other Ambulatory Visit: Payer: Self-pay | Admitting: Otolaryngology

## 2018-09-29 DIAGNOSIS — H9209 Otalgia, unspecified ear: Secondary | ICD-10-CM

## 2018-10-07 ENCOUNTER — Ambulatory Visit
Admission: RE | Admit: 2018-10-07 | Discharge: 2018-10-07 | Disposition: A | Discharge status: Home / Self Care | Payer: Medicare HMO | Source: Ambulatory Visit | Attending: Otolaryngology | Admitting: Otolaryngology

## 2018-10-07 ENCOUNTER — Other Ambulatory Visit: Payer: Self-pay

## 2018-10-07 DIAGNOSIS — H9209 Otalgia, unspecified ear: Secondary | ICD-10-CM | POA: Diagnosis not present

## 2019-05-10 ENCOUNTER — Other Ambulatory Visit: Payer: Self-pay | Admitting: Internal Medicine

## 2019-05-10 DIAGNOSIS — Z1231 Encounter for screening mammogram for malignant neoplasm of breast: Secondary | ICD-10-CM

## 2019-06-05 ENCOUNTER — Ambulatory Visit
Admission: RE | Admit: 2019-06-05 | Discharge: 2019-06-05 | Disposition: A | Discharge status: Home / Self Care | Payer: Medicare HMO | Source: Ambulatory Visit | Attending: Internal Medicine | Admitting: Internal Medicine

## 2019-06-05 DIAGNOSIS — Z1231 Encounter for screening mammogram for malignant neoplasm of breast: Secondary | ICD-10-CM | POA: Diagnosis not present

## 2020-01-22 ENCOUNTER — Other Ambulatory Visit: Payer: Self-pay | Admitting: Internal Medicine

## 2020-01-22 DIAGNOSIS — M8580 Other specified disorders of bone density and structure, unspecified site: Secondary | ICD-10-CM

## 2020-02-13 ENCOUNTER — Other Ambulatory Visit: Payer: Self-pay | Admitting: Acute Care

## 2020-02-13 DIAGNOSIS — H9203 Otalgia, bilateral: Secondary | ICD-10-CM

## 2020-02-13 DIAGNOSIS — I729 Aneurysm of unspecified site: Secondary | ICD-10-CM

## 2020-02-13 DIAGNOSIS — H9313 Tinnitus, bilateral: Secondary | ICD-10-CM

## 2020-02-28 ENCOUNTER — Other Ambulatory Visit: Payer: Self-pay

## 2020-02-28 ENCOUNTER — Ambulatory Visit
Admission: RE | Admit: 2020-02-28 | Discharge: 2020-02-28 | Disposition: A | Discharge status: Home / Self Care | Payer: Medicare HMO | Source: Ambulatory Visit | Attending: Internal Medicine | Admitting: Internal Medicine

## 2020-02-28 DIAGNOSIS — Z13820 Encounter for screening for osteoporosis: Secondary | ICD-10-CM | POA: Insufficient documentation

## 2020-02-28 DIAGNOSIS — M8580 Other specified disorders of bone density and structure, unspecified site: Secondary | ICD-10-CM | POA: Insufficient documentation

## 2020-02-28 DIAGNOSIS — Z780 Asymptomatic menopausal state: Secondary | ICD-10-CM | POA: Diagnosis not present

## 2020-03-04 ENCOUNTER — Ambulatory Visit
Admission: RE | Admit: 2020-03-04 | Discharge: 2020-03-04 | Disposition: A | Discharge status: Home / Self Care | Payer: Medicare HMO | Source: Ambulatory Visit | Attending: Acute Care | Admitting: Acute Care

## 2020-03-04 ENCOUNTER — Other Ambulatory Visit: Payer: Self-pay

## 2020-03-04 DIAGNOSIS — H9203 Otalgia, bilateral: Secondary | ICD-10-CM | POA: Insufficient documentation

## 2020-03-04 DIAGNOSIS — I663 Occlusion and stenosis of cerebellar arteries: Secondary | ICD-10-CM | POA: Insufficient documentation

## 2020-03-04 DIAGNOSIS — I729 Aneurysm of unspecified site: Secondary | ICD-10-CM | POA: Insufficient documentation

## 2020-03-04 DIAGNOSIS — G319 Degenerative disease of nervous system, unspecified: Secondary | ICD-10-CM | POA: Insufficient documentation

## 2020-03-04 DIAGNOSIS — H9313 Tinnitus, bilateral: Secondary | ICD-10-CM | POA: Diagnosis present

## 2021-04-27 DIAGNOSIS — R569 Unspecified convulsions: Secondary | ICD-10-CM

## 2021-04-27 HISTORY — DX: Unspecified convulsions: R56.9

## 2021-08-06 ENCOUNTER — Other Ambulatory Visit: Payer: Self-pay | Admitting: Student

## 2021-08-06 DIAGNOSIS — M19012 Primary osteoarthritis, left shoulder: Secondary | ICD-10-CM

## 2021-08-18 ENCOUNTER — Inpatient Hospital Stay: Admission: RE | Admit: 2021-08-18 | Payer: Medicare HMO | Source: Ambulatory Visit

## 2021-09-01 ENCOUNTER — Ambulatory Visit
Admission: RE | Admit: 2021-09-01 | Discharge: 2021-09-01 | Disposition: A | Discharge status: Home / Self Care | Payer: Medicare HMO | Source: Ambulatory Visit | Attending: Student | Admitting: Student

## 2021-09-01 DIAGNOSIS — M19012 Primary osteoarthritis, left shoulder: Secondary | ICD-10-CM

## 2022-03-18 ENCOUNTER — Emergency Department: Payer: Medicare Other

## 2022-03-18 ENCOUNTER — Other Ambulatory Visit: Payer: Self-pay

## 2022-03-18 ENCOUNTER — Inpatient Hospital Stay
Admission: EM | Admit: 2022-03-18 | Discharge: 2022-03-20 | DRG: 101 | Disposition: A | Discharge status: Other Acute Inpatient Hospital | Payer: Medicare Other | Attending: Internal Medicine | Admitting: Internal Medicine

## 2022-03-18 DIAGNOSIS — G459 Transient cerebral ischemic attack, unspecified: Secondary | ICD-10-CM | POA: Diagnosis not present

## 2022-03-18 DIAGNOSIS — R42 Dizziness and giddiness: Secondary | ICD-10-CM

## 2022-03-18 DIAGNOSIS — Z7982 Long term (current) use of aspirin: Secondary | ICD-10-CM

## 2022-03-18 DIAGNOSIS — R200 Anesthesia of skin: Secondary | ICD-10-CM

## 2022-03-18 DIAGNOSIS — R471 Dysarthria and anarthria: Secondary | ICD-10-CM | POA: Diagnosis present

## 2022-03-18 DIAGNOSIS — I160 Hypertensive urgency: Secondary | ICD-10-CM | POA: Diagnosis present

## 2022-03-18 DIAGNOSIS — R4781 Slurred speech: Secondary | ICD-10-CM | POA: Diagnosis present

## 2022-03-18 DIAGNOSIS — R4701 Aphasia: Secondary | ICD-10-CM

## 2022-03-18 DIAGNOSIS — R519 Headache, unspecified: Secondary | ICD-10-CM

## 2022-03-18 DIAGNOSIS — Z791 Long term (current) use of non-steroidal anti-inflammatories (NSAID): Secondary | ICD-10-CM

## 2022-03-18 DIAGNOSIS — I639 Cerebral infarction, unspecified: Secondary | ICD-10-CM | POA: Diagnosis present

## 2022-03-18 DIAGNOSIS — R569 Unspecified convulsions: Principal | ICD-10-CM

## 2022-03-18 DIAGNOSIS — E119 Type 2 diabetes mellitus without complications: Secondary | ICD-10-CM

## 2022-03-18 DIAGNOSIS — Z888 Allergy status to other drugs, medicaments and biological substances status: Secondary | ICD-10-CM

## 2022-03-18 DIAGNOSIS — Z79899 Other long term (current) drug therapy: Secondary | ICD-10-CM

## 2022-03-18 DIAGNOSIS — I10 Essential (primary) hypertension: Secondary | ICD-10-CM | POA: Diagnosis present

## 2022-03-18 LAB — CBC
HCT: 42.6 % (ref 36.0–46.0)
Hemoglobin: 13.9 g/dL (ref 12.0–15.0)
MCH: 29.3 pg (ref 26.0–34.0)
MCHC: 32.6 g/dL (ref 30.0–36.0)
MCV: 89.9 fL (ref 80.0–100.0)
Platelets: 121 10*3/uL — ABNORMAL LOW (ref 150–400)
RBC: 4.74 MIL/uL (ref 3.87–5.11)
RDW: 13.7 % (ref 11.5–15.5)
WBC: 6.7 10*3/uL (ref 4.0–10.5)
nRBC: 0 % (ref 0.0–0.2)

## 2022-03-18 LAB — COMPREHENSIVE METABOLIC PANEL
ALT: 18 U/L (ref 0–44)
AST: 22 U/L (ref 15–41)
Albumin: 4 g/dL (ref 3.5–5.0)
Alkaline Phosphatase: 57 U/L (ref 38–126)
Anion gap: 8 (ref 5–15)
BUN: 17 mg/dL (ref 8–23)
CO2: 25 mmol/L (ref 22–32)
Calcium: 9.3 mg/dL (ref 8.9–10.3)
Chloride: 104 mmol/L (ref 98–111)
Creatinine, Ser: 1.06 mg/dL — ABNORMAL HIGH (ref 0.44–1.00)
GFR, Estimated: 55 mL/min — ABNORMAL LOW (ref >60)
Glucose, Bld: 107 mg/dL — ABNORMAL HIGH (ref 70–99)
Potassium: 3.6 mmol/L (ref 3.5–5.1)
Sodium: 137 mmol/L (ref 135–145)
Total Bilirubin: 0.9 mg/dL (ref 0.3–1.2)
Total Protein: 7.3 g/dL (ref 6.5–8.1)

## 2022-03-18 LAB — DIFFERENTIAL
Abs Immature Granulocytes: 0.03 10*3/uL (ref 0.00–0.07)
Basophils Absolute: 0.1 10*3/uL (ref 0.0–0.1)
Basophils Relative: 1 %
Eosinophils Absolute: 0.1 10*3/uL (ref 0.0–0.5)
Eosinophils Relative: 2 %
Immature Granulocytes: 0 %
Lymphocytes Relative: 34 %
Lymphs Abs: 2.3 10*3/uL (ref 0.7–4.0)
Monocytes Absolute: 1.2 10*3/uL — ABNORMAL HIGH (ref 0.1–1.0)
Monocytes Relative: 18 %
Neutro Abs: 3 10*3/uL (ref 1.7–7.7)
Neutrophils Relative %: 45 %

## 2022-03-18 LAB — APTT: aPTT: 27 seconds (ref 24–36)

## 2022-03-18 LAB — PROTIME-INR
INR: 1 (ref 0.8–1.2)
Prothrombin Time: 13.1 seconds (ref 11.4–15.2)

## 2022-03-18 LAB — ETHANOL: Alcohol, Ethyl (B): <10 mg/dL (ref <10)

## 2022-03-18 MED ORDER — ALLOPURINOL 100 MG PO TABS
100.0000 mg | ORAL_TABLET | Freq: Every day | ORAL | Status: DC
  Administered 2022-03-19 – 2022-03-20 (×2): 100 mg via ORAL
  Filled 2022-03-18 (×2): qty 1

## 2022-03-18 MED ORDER — COLCHICINE 0.6 MG PO TABS: 0.6000 mg | ORAL_TABLET | Freq: Every day | ORAL | Status: DC | PRN

## 2022-03-18 MED ORDER — SENNOSIDES-DOCUSATE SODIUM 8.6-50 MG PO TABS: 1.0000 | ORAL_TABLET | Freq: Every evening | ORAL | Status: DC | PRN

## 2022-03-18 MED ORDER — ACETAMINOPHEN 325 MG PO TABS
650.0000 mg | ORAL_TABLET | ORAL | Status: DC | PRN
  Administered 2022-03-19 – 2022-03-20 (×6): 650 mg via ORAL
  Filled 2022-03-18 (×6): qty 2

## 2022-03-18 MED ORDER — NICARDIPINE HCL IN NACL 20-0.86 MG/200ML-% IV SOLN: 3.0000 mg/h | INTRAVENOUS | Status: DC

## 2022-03-18 MED ORDER — SODIUM CHLORIDE 0.9% FLUSH
3.0000 mL | Freq: Once | INTRAVENOUS | Status: AC
  Administered 2022-03-18: 3 mL via INTRAVENOUS

## 2022-03-18 MED ORDER — POLYETHYLENE GLYCOL 3350 17 GM/SCOOP PO POWD: 17.0000 g | Freq: Every day | ORAL | Status: DC | PRN

## 2022-03-18 MED ORDER — GABAPENTIN 300 MG PO CAPS
300.0000 mg | ORAL_CAPSULE | Freq: Three times a day (TID) | ORAL | Status: DC
  Administered 2022-03-18 – 2022-03-20 (×7): 300 mg via ORAL
  Filled 2022-03-18 (×7): qty 1

## 2022-03-18 MED ORDER — ASPIRIN 81 MG PO CHEW
81.0000 mg | CHEWABLE_TABLET | Freq: Every day | ORAL | Status: DC
  Administered 2022-03-19 – 2022-03-20 (×2): 81 mg via ORAL
  Filled 2022-03-18 (×2): qty 1

## 2022-03-18 MED ORDER — ACETAMINOPHEN 160 MG/5ML PO SOLN: 650.0000 mg | ORAL | Status: DC | PRN

## 2022-03-18 MED ORDER — CLOPIDOGREL BISULFATE 75 MG PO TABS
75.0000 mg | ORAL_TABLET | Freq: Every day | ORAL | Status: DC
  Administered 2022-03-18 – 2022-03-20 (×3): 75 mg via ORAL
  Filled 2022-03-18 (×3): qty 1

## 2022-03-18 MED ORDER — SODIUM CHLORIDE 0.9 % IV SOLN: INTRAVENOUS | Status: DC

## 2022-03-18 MED ORDER — STROKE: EARLY STAGES OF RECOVERY BOOK: Freq: Once | Status: AC

## 2022-03-18 MED ORDER — ACETAMINOPHEN 650 MG RE SUPP: 650.0000 mg | RECTAL | Status: DC | PRN

## 2022-03-18 MED ORDER — ACETAMINOPHEN 500 MG PO TABS: 500.0000 mg | ORAL_TABLET | Freq: Four times a day (QID) | ORAL | Status: DC | PRN

## 2022-03-18 MED ORDER — PRAVASTATIN SODIUM 20 MG PO TABS
80.0000 mg | ORAL_TABLET | Freq: Every day | ORAL | Status: DC
  Administered 2022-03-18 – 2022-03-20 (×3): 80 mg via ORAL
  Filled 2022-03-18 (×3): qty 4

## 2022-03-18 NOTE — Assessment & Plan Note (Signed)
Patient presented to the ER for evaluation of slurred speech and right arm numbness and was noted to have elevated blood pressure with systolic blood pressure of 210 mmHg Initially her symptoms were thought to be related to hypertensive emergency but blood pressure has improved without any intervention We will allow for permissive hypertension until an acute stroke is ruled out

## 2022-03-18 NOTE — ED Notes (Signed)
Informed RN bed assigned 

## 2022-03-18 NOTE — ED Triage Notes (Signed)
Pt to ED via EMS from home. Pt states started having a headache last night around midnight, woke up this morning with head still hurting but comes and goes during daytime. At about 1400 pt stated headache came back even worse, started feeling "woozy" and "didn't know if I was coming or going." EMS reports BP in 200's/lower 100's en route.

## 2022-03-18 NOTE — ED Provider Notes (Signed)
[facility] Provider Note    Event Date/Time   First MD Initiated Contact with Patient 03/18/22 1545     (approximate)   History   No chief complaint on file.   HPI  Krista Anderson is a 76 y.o. female   Past medical history of hypertension who presents to the emergency department as a stroke code for slurred speech and unsteady gait reported by family.  She has had a headache for several weeks and has had high blood pressure which has been poorly controlled.  This morning had some mild slurred speech reported by family and then around 230 seemed to be unsteady with her gait.    She comes to the emergency department and is immediately evaluated by neurologist Dr. [name] at patient's bedside and stroke score is 0.  Patient is alert and oriented.  She went straight to the CT scanner.  History was obtained via the patient.  Collateral information given by independent historians EMS with history as above.      Physical Exam   Triage Vital Signs: ED Triage Vitals  Enc Vitals Group     BP      Pulse      Resp      Temp      Temp src      SpO2      Weight      Height      Head Circumference      Peak Flow      Pain Score      Pain Loc      Pain Edu?      Excl. in GC?     Most recent vital signs: Vitals:   03/18/22 1553  BP: (!) 153/95  Pulse: 75  Resp: (!) 23  Temp: 97.8 F (36.6 C)  SpO2: 99%    General: Awake, no distress.  CV:  Good peripheral perfusion.  Resp:  Normal effort. Lungs clear Abd:  No distention.  Other:  No focal neurological deficits, normal motor and sensory, no facial asymmetry, alert and oriented.   ED Results / Procedures / Treatments   Labs (all labs ordered are listed, but only abnormal results are displayed) Labs Reviewed  CBC - Abnormal; Notable for the following components:      Result Value   Platelets 121 (*)    All other components within normal limits  DIFFERENTIAL - Abnormal; Notable for the  following components:   Monocytes Absolute 1.2 (*)    All other components within normal limits  COMPREHENSIVE METABOLIC PANEL - Abnormal; Notable for the following components:   Glucose, Bld 107 (*)    Creatinine, Ser 1.06 (*)    GFR, Estimated 55 (*)    All other components within normal limits  PROTIME-INR  APTT  ETHANOL  CBG MONITORING, ED     I reviewed labs and they are notable for fattening is 1.06 which is just slightly elevated from prior and glucose is 107.  EKG  ED ECG REPORT I, [name], the attending physician, personally viewed and interpreted this ECG.   Date: 03/18/2022  EKG Time: 1553  Rate: 76  Rhythm: normal sinus rhythm  Axis: nl  Intervals:none  ST&T Change: no acute ischemic changes    RADIOLOGY I independently reviewed and interpreted CT of the head and see no obvious head bleed or midline shift   PROCEDURES:  Critical Care performed: Yes, see critical care procedure note(s)  .Critical Care  Performed by: [name], MD Authorized by: [name], MD   Critical care provider statement:    Critical care time (minutes):  30   Critical care was necessary to treat or prevent imminent or life-threatening deterioration of the following conditions:  CNS failure or compromise   Critical care was time spent personally by me on the following activities:  Development of treatment plan with patient or surrogate, discussions with consultants, evaluation of patient's response to treatment, examination of patient, ordering and review of laboratory studies, ordering and review of radiographic studies, ordering and performing treatments and interventions, pulse oximetry, re-evaluation of patient's condition and review of old charts    MEDICATIONS ORDERED IN ED: Medications  nicardipine (CARDENE) 20mg  in 0.86% saline IV infusion (0.1 mg/ml) (has no administration in time range)  sodium chloride flush (NS) 0.9 % injection 3 mL (3 mLs Intravenous  Given 03/18/22 1604)    Consultants:  I spoke with neurologist consultant Dr. 03/20/22 regarding care plan for this patient.   IMPRESSION / MDM / ASSESSMENT AND PLAN / ED COURSE  I reviewed the triage vital signs and the nursing notes.                              Differential diagnosis includes, but is not limited to, CVA, intracranial bleeding, electrolyte derangements, infection, hypertensive crisis.   The patient is on the cardiac monitor to evaluate for evidence of arrhythmia and/or significant heart rate changes.  MDM: Code canceled after CT scan was reviewed by neurologist.  Suspect hypertensive emergency with transient resolving results also consider TIA will be admitted for work-up.  Patient feels comfortable at the moment, I reassessed her after her CT scan and she only complains of a mild headache currently.  No chest pain, no recent illnesses.  She states that her antihypertensives have been changed recently by her primary doctor.  Goal blood pressure reduction from systolic of 210 initially to target approximately 25% reduction no less than 160 systolic.  I ordered a nicardipine drip for blood pressure control in the setting of suspected hypertensive emergency.  However her recheck of blood pressure was 150/90 without intervention, we will continue to monitor and treat as needed.  Admit for TIA w/u   Patient's presentation is most consistent with acute presentation with potential threat to life or bodily function.       FINAL CLINICAL IMPRESSION(S) / ED DIAGNOSES   Final diagnoses:  Hypertension, unspecified type  Slurred speech     Rx / DC Orders   ED Discharge Orders     None        Note:  This document was prepared using Dragon voice recognition software and may include unintentional dictation errors.    Selina Cooley, MD 03/18/22 [phone]
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
## 2022-03-18 NOTE — Progress Notes (Signed)
SLP Cancellation Note  Patient Details Name: Krista Anderson MRN: [id number] DOB: [birth year]   Cancelled treatment:       Reason Eval/Treat Not Completed: SLP screened, no needs identified, will sign off (chart reviewed) Per admitting MD note, "Patient was seen immediately by Neurology who canceled the code stroke since patient's symptoms appear to be secondary to hypertensive emergency.  She had a CAT scan of the head without contrast which showed no acute intracranial abnormalities. Chronic atrophy and small vessel ischemic changes.  Patient's initial blood pressure was 210 systolic without any intervention and normalized to 150.  Her slurred speech has resolved and right arm numbness has resolved as well.". No further skilled ST services indicated as pt appears to have returned to her baseline(speech). NSG to reconsult if any change in status while admitted.  Communicated via secure chat w/ the MD re: NSG performing the Yale swallow screen and ordering of diet when appropriate.    Jerilynn Som, MS, CCC-SLP Speech Language Pathologist Rehab Services; [facility] [phone] (ascom) [place] 03/18/2022, 5:41 PM

## 2022-03-18 NOTE — Assessment & Plan Note (Addendum)
Patient presents to the ER for evaluation of slurred speech, right arm numbness which was resolved. MRI of the brain without contrast ruled out an acute stroke. Neurology recommends aspirin, Plavix and high intensity statins -continue  -Pending 2D echocardiogram to assess LVEF and rule out cardiac thrombus PT/OT eval pending.  ST eval-no needs identified and they signed off  Patient had a code stroke called this morning by nursing for aphasia/dysarthria -with unknown timing she was not a candidate for TNK.  CTA head and neck negative for large vessel occlusion.  Neurology will reassess her today to decide need for MRI brain with and without contrast along with EEG
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
## 2022-03-18 NOTE — Assessment & Plan Note (Signed)
Diet controlled.  Will check hemoglobin A1c.  Last A1c in the system shows 6.4 back in 2017

## 2022-03-19 ENCOUNTER — Observation Stay: Payer: Medicare Other

## 2022-03-19 ENCOUNTER — Encounter: Payer: Self-pay | Admitting: Radiology

## 2022-03-19 ENCOUNTER — Observation Stay (HOSPITAL_COMMUNITY)
Admit: 2022-03-19 | Discharge: 2022-03-19 | Disposition: A | Discharge status: Home / Self Care | Payer: Medicare Other | Attending: Internal Medicine | Admitting: Internal Medicine

## 2022-03-19 ENCOUNTER — Inpatient Hospital Stay: Payer: Medicare Other

## 2022-03-19 DIAGNOSIS — I10 Essential (primary) hypertension: Secondary | ICD-10-CM | POA: Diagnosis present

## 2022-03-19 DIAGNOSIS — R519 Headache, unspecified: Secondary | ICD-10-CM | POA: Diagnosis present

## 2022-03-19 DIAGNOSIS — G459 Transient cerebral ischemic attack, unspecified: Secondary | ICD-10-CM

## 2022-03-19 DIAGNOSIS — Z79899 Other long term (current) drug therapy: Secondary | ICD-10-CM | POA: Diagnosis not present

## 2022-03-19 DIAGNOSIS — R471 Dysarthria and anarthria: Secondary | ICD-10-CM

## 2022-03-19 DIAGNOSIS — Z791 Long term (current) use of non-steroidal anti-inflammatories (NSAID): Secondary | ICD-10-CM | POA: Diagnosis not present

## 2022-03-19 DIAGNOSIS — D696 Thrombocytopenia, unspecified: Secondary | ICD-10-CM | POA: Diagnosis not present

## 2022-03-19 DIAGNOSIS — R569 Unspecified convulsions: Secondary | ICD-10-CM | POA: Diagnosis present

## 2022-03-19 DIAGNOSIS — I160 Hypertensive urgency: Secondary | ICD-10-CM | POA: Diagnosis not present

## 2022-03-19 DIAGNOSIS — M109 Gout, unspecified: Secondary | ICD-10-CM | POA: Diagnosis not present

## 2022-03-19 DIAGNOSIS — Z7982 Long term (current) use of aspirin: Secondary | ICD-10-CM | POA: Diagnosis not present

## 2022-03-19 DIAGNOSIS — R4781 Slurred speech: Secondary | ICD-10-CM | POA: Diagnosis present

## 2022-03-19 DIAGNOSIS — R4701 Aphasia: Secondary | ICD-10-CM | POA: Diagnosis present

## 2022-03-19 DIAGNOSIS — R414 Neurologic neglect syndrome: Secondary | ICD-10-CM | POA: Diagnosis not present

## 2022-03-19 DIAGNOSIS — E669 Obesity, unspecified: Secondary | ICD-10-CM | POA: Diagnosis not present

## 2022-03-19 DIAGNOSIS — I639 Cerebral infarction, unspecified: Secondary | ICD-10-CM | POA: Diagnosis present

## 2022-03-19 DIAGNOSIS — E785 Hyperlipidemia, unspecified: Secondary | ICD-10-CM | POA: Diagnosis not present

## 2022-03-19 DIAGNOSIS — E119 Type 2 diabetes mellitus without complications: Secondary | ICD-10-CM

## 2022-03-19 DIAGNOSIS — Z6835 Body mass index (BMI) 35.0-35.9, adult: Secondary | ICD-10-CM | POA: Diagnosis not present

## 2022-03-19 DIAGNOSIS — Z888 Allergy status to other drugs, medicaments and biological substances status: Secondary | ICD-10-CM | POA: Diagnosis not present

## 2022-03-19 DIAGNOSIS — Z8673 Personal history of transient ischemic attack (TIA), and cerebral infarction without residual deficits: Secondary | ICD-10-CM | POA: Diagnosis not present

## 2022-03-19 DIAGNOSIS — G4089 Other seizures: Secondary | ICD-10-CM | POA: Diagnosis present

## 2022-03-19 DIAGNOSIS — Z90710 Acquired absence of both cervix and uterus: Secondary | ICD-10-CM | POA: Diagnosis not present

## 2022-03-19 LAB — LIPID PANEL
Cholesterol: 127 mg/dL (ref 0–200)
HDL: 49 mg/dL (ref >40)
LDL Cholesterol: 65 mg/dL (ref 0–99)
Total CHOL/HDL Ratio: 2.6 RATIO
Triglycerides: 67 mg/dL (ref <150)
VLDL: 13 mg/dL (ref 0–40)

## 2022-03-19 LAB — ECHOCARDIOGRAM COMPLETE
AR max vel: 1.55 cm2
AV Area VTI: 1.61 cm2
AV Area mean vel: 1.56 cm2
AV Mean grad: 4 mmHg
AV Peak grad: 8.2 mmHg
Ao pk vel: 1.43 m/s
Area-P 1/2: 4.8 cm2
Height: 64 in
S' Lateral: 2.9 cm
Weight: 3200 oz

## 2022-03-19 MED ORDER — IOHEXOL 350 MG/ML SOLN
100.0000 mL | Freq: Once | INTRAVENOUS | Status: AC | PRN
  Administered 2022-03-19: 100 mL via INTRAVENOUS

## 2022-03-19 MED ORDER — GADOBUTROL 1 MMOL/ML IV SOLN
9.0000 mL | Freq: Once | INTRAVENOUS | Status: AC | PRN
  Administered 2022-03-19: 9 mL via INTRAVENOUS

## 2022-03-19 MED ORDER — SODIUM CHLORIDE 0.9 % IV SOLN: INTRAVENOUS | Status: DC

## 2022-03-19 MED ORDER — LEVETIRACETAM IN NACL 500 MG/100ML IV SOLN
500.0000 mg | Freq: Two times a day (BID) | INTRAVENOUS | Status: DC
  Administered 2022-03-20: 500 mg via INTRAVENOUS
  Filled 2022-03-19 (×2): qty 100

## 2022-03-19 MED ORDER — SODIUM CHLORIDE 0.9 % IV SOLN
2000.0000 mg | Freq: Once | INTRAVENOUS | Status: AC
  Administered 2022-03-19: 2000 mg via INTRAVENOUS
  Filled 2022-03-19: qty 20

## 2022-03-19 NOTE — Progress Notes (Signed)
*  PRELIMINARY RESULTS* Echocardiogram 2D Echocardiogram has been performed.  Krista Anderson Krista Anderson 03/19/2022, 11:50 AM

## 2022-03-19 NOTE — Progress Notes (Signed)
Eeg done 

## 2022-03-19 NOTE — Progress Notes (Signed)
Upon assessment this morning noticed patient with severe aphasia and dysarthria. NIH changed from 0 to 5. BP 160/74. Rest of VSS. Denies pain or a headache. Called Code stroke. Dr. Sherryll Burger has been notified and is present in the room. Patient will be transport for head CT.

## 2022-03-19 NOTE — Progress Notes (Signed)
Inpatient Rehab Admissions Coordinator:   Per PT recommendations, patient was screened for CIR candidacy by Megan Salon, MS, CCC-SLP . At this time, Pt. does not appear to demonstrate medical necessity to justify in hospital rehabilitation/CIR; however, note new code stroke called and MRI w/o contrast is currently pending. I will not place consult at this time, but will rescreen once work up is complete.  Please contact me with any questions.  Megan Salon, MS, CCC-SLP Rehab Admissions Coordinator  [phone] (celll) [phone] (office)
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
## 2022-03-19 NOTE — Plan of Care (Signed)
Last NIH 3. Patient with severe expressive aphasia. She follows commands. Tylenol given once for headache. Patient was asleep on reassessment. Currently she is off the floor for MRI.

## 2022-03-19 NOTE — Progress Notes (Signed)
At the time EKG order was placed pt was in CT. Checked with MD to see if EKG still needs to be completed-Per Dr. Selina Cooley, neurologist do not do an EKG at this time.

## 2022-03-19 NOTE — Procedures (Signed)
Routine EEG Report  Krista Anderson is a 76 y.o. female with a history of aphasia who is undergoing an EEG to evaluate for seizures.  Report: This EEG was acquired with electrodes placed according to the International 10-20 electrode system (including Fp1, Fp2, F3, F4, C3, C4, P3, P4, O1, O2, T3, T4, T5, T6, A1, A2, Fz, Cz, Pz). The following electrodes were missing or displaced: none.  The occipital dominant rhythm was 8.5 Hz. This activity is reactive to stimulation. Drowsiness was manifested by background fragmentation; deeper stages of sleep were identified by K complexes and sleep spindles. There was focal slowing over the left hemisphere in the theta range. There were no interictal epileptiform discharges. There were no electrographic seizures identified. Photic stimulation and hyperventilation were not performed.  Impression and clinical correlation: This EEG was obtained while awake and asleep and is abnormal due to moderate slowing over the left hemisphere indicative of focal cerebral dysfunction in that region. Epileptiform abnormalities were not seen during this recording.  [name], MD [facility] [phone]  If 7pm- 7am, please page neurology on call as listed in [facility].

## 2022-03-19 NOTE — Progress Notes (Signed)
  Progress Note   Patient: Krista Anderson NIO:[id number] DOB: 30-Aug-1945 DOA: 03/18/2022     0 DOS: the patient was seen and examined on 03/19/2022   Brief hospital course: 76 year old female with a known history of hypertension, diabetes, obesity, gout is admitted for suspected stroke  11/23: Inpatient code stroke/RRT called this morning for slurred speech/dysarthria.  CT head negative.  Neuro reeval, may need EEG and or MRI brain with and without contrast  Assessment and Plan: * TIA (transient ischemic attack) Patient presents to the ER for evaluation of slurred speech, right arm numbness which was resolved. MRI of the brain without contrast ruled out an acute stroke. Neurology recommends aspirin, Plavix and high intensity statins -continue  -Pending 2D echocardiogram to assess LVEF and rule out cardiac thrombus PT/OT eval pending.  ST eval-no needs identified and they signed off  Patient had a code stroke called this morning by nursing for aphasia/dysarthria -with unknown timing she was not a candidate for TNK.  CTA head and neck negative for large vessel occlusion.  Neurology will reassess her today to decide need for MRI brain with and without contrast along with EEG  Diabetes mellitus (HCC) Diet controlled.  Will check hemoglobin A1c.  Last A1c in the system shows 6.4 back in 2017   Hypertensive urgency Patient presented to the ER for evaluation of slurred speech and right arm numbness and was noted to have elevated blood pressure with systolic blood pressure of 210 mmHg Initially her symptoms were thought to be related to hypertensive emergency but blood pressure has improved without any intervention Her blood pressure has improved         Subjective: Was having speech disturbances.  She was unable to say her name or age and had difficulty following commands so code stroke was called  Physical Exam: Vitals:   03/19/22 0725 03/19/22 0759 03/19/22 0951 03/19/22 1155  BP: (!)  147/57 (!) 160/74 (!) 159/72 105/89  Pulse: 70 78 67 73  Resp: 18 17  17   Temp: 97.9 F (36.6 C)     TempSrc:      SpO2: 98% 95% 98% 100%  Weight:      Height:       76 year old female lying in the bed comfortably without any acute distress Lungs clear to auscultation bilaterally Cardiovascular regular rate and rhythm Abdomen soft, benign Neuro patient is alert and awake.  She is unable to say her name/age and having difficulty following commands.  No focal weakness Data Reviewed:  There are no new results to review at this time.  Family Communication: Daughter was updated over phone  Disposition: Status is: Inpatient Remains inpatient appropriate because: Stroke/seizure workup  Planned Discharge Destination: Home with Home Health   DVT prophylaxis-SCDs Time spent: 35 minutes  Author: 62, MD 03/19/2022 12:52 PM  For on call review [email or web address].

## 2022-03-19 NOTE — Progress Notes (Signed)
Responded to Code Stroke. On arrival, pt's RN, RRRN, Dr. Karlene Lineman in room. Maintaining airway, speech slurred.

## 2022-03-19 NOTE — Evaluation (Addendum)
Physical Therapy Evaluation Patient Details Name: Krista Anderson MRN: [id number] DOB: 05-07-1945 Today's Date: 03/19/2022  History of Present Illness  76 year old female presenting with TIA. PMH of hypertension, diabetes, obesity, and gout.  Clinical Impression  Pt found supine in bed upon PT entry. Pt showing expressive and receptive aphasia difficulties, intermittently able to follow commands as well as recognize what she was saying did not match what she intended. Mod-max re-direction needed during session. Pt oriented to name only. Pt required supervision with bed mobility and HOB elevated. Sit<>Stand with RW, CGA, and max VC for hand placement and RW mechanics. modAx2 with RW assistance to side step at EOB. Constant tactile cues and facilitation due to pt's difficulty motor planning to take a few steps towards HOB/forwards. Some improvement in ability with automatic tasks noted (able to hand pt her socks, and she donned with minA/CGA). Pt would benefit from skilled physical therapy to address the listed deficits (see below) to increase independence with ADLs and function. Current recommendation is inpatient rehab to return pt to PLOF, and to maximize therapies to improve pt outcomes.         Recommendations for follow up therapy are one component of a multi-disciplinary discharge planning process, led by the attending physician.  Recommendations may be updated based on patient status, additional functional criteria and insurance authorization.  Follow Up Recommendations Acute inpatient rehab (3hours/day)      Assistance Recommended at Discharge Frequent or constant Supervision/Assistance  Patient can return home with the following  A lot of help with walking and/or transfers;A lot of help with bathing/dressing/bathroom;Assistance with feeding;Assistance with cooking/housework;Direct supervision/assist for medications management;Help with stairs or ramp for entrance;Assist for  transportation    Equipment Recommendations Rolling walker (2 wheels);BSC/3in1  Recommendations for Other Services  OT consult    Functional Status Assessment Patient has had a recent decline in their functional status and demonstrates the ability to make significant improvements in function in a reasonable and predictable amount of time.     Precautions / Restrictions Precautions Precautions: Fall Restrictions Weight Bearing Restrictions: No      Mobility  Bed Mobility Overal bed mobility: Needs Assistance Bed Mobility: Supine to Sit     Supine to sit: Supervision Sit to supine: Supervision        Transfers Overall transfer level: Needs assistance   Transfers: Sit to/from Stand Sit to Stand: Min guard           General transfer comment: unable to understand VC    Ambulation/Gait Ambulation/Gait assistance: Mod assist, +2 physical assistance, +2 safety/equipment Gait Distance (Feet): 3 Feet Assistive device: Rolling walker (2 wheels)  constant tactile cues, facilitation/assistance of lateral stepping via PTs feet. modA to steady and safely sidestep, with RW assistance           Stairs            Wheelchair Mobility    Modified Rankin (Stroke Patients Only)       Balance Overall balance assessment: Needs assistance Sitting-balance support: Bilateral upper extremity supported, Feet supported Sitting balance-Leahy Scale: Fair     Standing balance support: Reliant on assistive device for balance, Bilateral upper extremity supported Standing balance-Leahy Scale: Fair                               Pertinent Vitals/Pain Pain Assessment Pain Assessment: Faces Faces Pain Scale: No hurt    Home Living Family/patient expects to  be discharged to:: Private residence Living Arrangements: Children Available Help at Discharge: Family;Available 24 hours/day Type of Home: House Home Access: Other (comment) (unable to assess due to  cognitive deficits)       Home Layout: Other (Comment) (unable to assess due to cognitive deficits) Home Equipment: Other (comment) Additional Comments: unable to assess due to cognitive deficits    Prior Function               Mobility Comments: unable to assess due to cognitive deficits ADLs Comments: unable to assess due to cognitive deficits     Hand Dominance        Extremity/Trunk Assessment   Upper Extremity Assessment Upper Extremity Assessment: Difficult to assess due to impaired cognition    Lower Extremity Assessment Lower Extremity Assessment: Difficult to assess due to impaired cognition    Cervical / Trunk Assessment Cervical / Trunk Assessment: Normal  Communication   Communication: Expressive difficulties;Receptive difficulties  Cognition Arousal/Alertness: Awake/alert Behavior During Therapy:  (receptive and expressive difficulties) Overall Cognitive Status: Difficult to assess Area of Impairment: Orientation, Awareness, Attention, Following commands, Safety/judgement  Follows one step commands inconsistently                             General Comments: receptive and expressive difficulties        General Comments      Exercises Other Exercises Other Exercises: facilitation of side stepping standing in RW   Assessment/Plan    PT Assessment Patient needs continued PT services  PT Problem List Decreased strength;Decreased balance;Decreased cognition;Decreased mobility;Decreased knowledge of use of DME;Decreased activity tolerance;Decreased coordination       PT Treatment Interventions DME instruction;Functional mobility training;Balance training;Patient/family education;Gait training;Therapeutic activities;Neuromuscular re-education;Stair training;Therapeutic exercise    PT Goals (Current goals can be found in the Care Plan section)  Acute Rehab PT Goals PT Goal Formulation: Patient unable to participate in goal setting     Frequency Min 2X/week     Co-evaluation               AM-PAC PT "6 Clicks" Mobility  Outcome Measure Help needed turning from your back to your side while in a flat bed without using bedrails?: None Help needed moving from lying on your back to sitting on the side of a flat bed without using bedrails?: A Little Help needed moving to and from a bed to a chair (including a wheelchair)?: A Lot Help needed standing up from a chair using your arms (e.g., wheelchair or bedside chair)?: A Little Help needed to walk in hospital room?: A Lot Help needed climbing 3-5 steps with a railing? : Total 6 Click Score: 15    End of Session Equipment Utilized During Treatment: Gait belt Activity Tolerance: Other (comment) (limited by receptive and expressive difficulties) Patient left: in bed;with call bell/phone within reach;with bed alarm set Nurse Communication: Other (comment) (receptive and expressive difficulties) PT Visit Diagnosis: Unsteadiness on feet (R26.81);Other abnormalities of gait and mobility (R26.89);Difficulty in walking, not elsewhere classified (R26.2);Other symptoms and signs involving the nervous system (R29.898)    Time: 9381-8299 PT Time Calculation (min) (ACUTE ONLY): 27 min   Charges:   PT Evaluation $PT Eval Low Complexity: 1 Low PT Treatments $Therapeutic Activity: 23-37 mins        [employer], SPT 03/19/2022, 2:40 PM

## 2022-03-19 NOTE — Significant Event (Signed)
Rapid Response Event Note   Reason for Call :   Code Stroke Initial Focused Assessment:   Per Bedside RN - patient is here for stroke- but upon her assessment this morning- patient had slurred speech which RN stated was new.  Dr. Sherryll Burger at bedside. Vitals stable.    Interventions:  Dr. Sherryll Burger- ordered CT of head  Plan of Care:  After CT scans- Dr. Selina Cooley Neurologist- stated no need for TPA- patient transferred back to patient's room.   Event Summary:   MD Notified: Dr. Sherryll Burger Call Time:07:45 Arrival Time:07:45 End Time:08:30  Vincente Poli, RN

## 2022-03-19 NOTE — Hospital Course (Addendum)
76 year old female with a known history of hypertension, diabetes, obesity, gout is admitted for suspected stroke  11/23: Inpatient code stroke/RRT called this morning for slurred speech/dysarthria.  CT head negative.  Neuro reeval, may need EEG and or MRI brain with and without contrast 11/24: PT, OT recommends CIR.  EEG concerning for focal slowing over left hemisphere.  Keppra started per neurology

## 2022-03-20 ENCOUNTER — Encounter (HOSPITAL_COMMUNITY): Payer: Self-pay

## 2022-03-20 ENCOUNTER — Inpatient Hospital Stay (HOSPITAL_COMMUNITY)
Admission: RE | Admit: 2022-03-20 | Discharge: 2022-03-22 | DRG: 101 | Disposition: A | Discharge status: Home / Self Care | Payer: Medicare Other | Attending: Internal Medicine | Admitting: Internal Medicine

## 2022-03-20 DIAGNOSIS — R4701 Aphasia: Secondary | ICD-10-CM | POA: Diagnosis present

## 2022-03-20 DIAGNOSIS — R569 Unspecified convulsions: Secondary | ICD-10-CM

## 2022-03-20 DIAGNOSIS — Z8673 Personal history of transient ischemic attack (TIA), and cerebral infarction without residual deficits: Secondary | ICD-10-CM

## 2022-03-20 DIAGNOSIS — E669 Obesity, unspecified: Secondary | ICD-10-CM | POA: Diagnosis present

## 2022-03-20 DIAGNOSIS — Z7982 Long term (current) use of aspirin: Secondary | ICD-10-CM

## 2022-03-20 DIAGNOSIS — M109 Gout, unspecified: Secondary | ICD-10-CM | POA: Insufficient documentation

## 2022-03-20 DIAGNOSIS — G459 Transient cerebral ischemic attack, unspecified: Secondary | ICD-10-CM | POA: Insufficient documentation

## 2022-03-20 DIAGNOSIS — Z90710 Acquired absence of both cervix and uterus: Secondary | ICD-10-CM

## 2022-03-20 DIAGNOSIS — R471 Dysarthria and anarthria: Secondary | ICD-10-CM | POA: Diagnosis present

## 2022-03-20 DIAGNOSIS — Z79899 Other long term (current) drug therapy: Secondary | ICD-10-CM

## 2022-03-20 DIAGNOSIS — Z6835 Body mass index (BMI) 35.0-35.9, adult: Secondary | ICD-10-CM

## 2022-03-20 DIAGNOSIS — I10 Essential (primary) hypertension: Secondary | ICD-10-CM | POA: Insufficient documentation

## 2022-03-20 DIAGNOSIS — G4089 Other seizures: Principal | ICD-10-CM | POA: Diagnosis present

## 2022-03-20 DIAGNOSIS — D696 Thrombocytopenia, unspecified: Secondary | ICD-10-CM | POA: Insufficient documentation

## 2022-03-20 DIAGNOSIS — E119 Type 2 diabetes mellitus without complications: Secondary | ICD-10-CM

## 2022-03-20 DIAGNOSIS — E785 Hyperlipidemia, unspecified: Secondary | ICD-10-CM | POA: Diagnosis present

## 2022-03-20 LAB — CBC
HCT: 38.6 % (ref 36.0–46.0)
Hemoglobin: 12.6 g/dL (ref 12.0–15.0)
MCH: 29.3 pg (ref 26.0–34.0)
MCHC: 32.6 g/dL (ref 30.0–36.0)
MCV: 89.8 fL (ref 80.0–100.0)
Platelets: 107 10*3/uL — ABNORMAL LOW (ref 150–400)
RBC: 4.3 MIL/uL (ref 3.87–5.11)
RDW: 13.9 % (ref 11.5–15.5)
WBC: 7.8 10*3/uL (ref 4.0–10.5)
nRBC: 0 % (ref 0.0–0.2)

## 2022-03-20 LAB — BASIC METABOLIC PANEL
Anion gap: 5 (ref 5–15)
BUN: 12 mg/dL (ref 8–23)
CO2: 24 mmol/L (ref 22–32)
Calcium: 8.6 mg/dL — ABNORMAL LOW (ref 8.9–10.3)
Chloride: 109 mmol/L (ref 98–111)
Creatinine, Ser: 0.75 mg/dL (ref 0.44–1.00)
GFR, Estimated: >60 mL/min (ref >60)
Glucose, Bld: 117 mg/dL — ABNORMAL HIGH (ref 70–99)
Potassium: 3.6 mmol/L (ref 3.5–5.1)
Sodium: 138 mmol/L (ref 135–145)

## 2022-03-20 LAB — HEMOGLOBIN A1C
Hgb A1c MFr Bld: 5.9 % — ABNORMAL HIGH (ref 4.8–5.6)
Mean Plasma Glucose: 123 mg/dL

## 2022-03-20 MED ORDER — LEVETIRACETAM IN NACL 1000 MG/100ML IV SOLN
1000.0000 mg | Freq: Two times a day (BID) | INTRAVENOUS | Status: DC
  Administered 2022-03-20: 1000 mg via INTRAVENOUS
  Filled 2022-03-20: qty 100

## 2022-03-20 MED ORDER — LEVETIRACETAM IN NACL 1000 MG/100ML IV SOLN: 1000.0000 mg | Freq: Two times a day (BID) | INTRAVENOUS | 0 refills | Status: DC

## 2022-03-20 MED ORDER — CLOPIDOGREL BISULFATE 75 MG PO TABS: 75.0000 mg | ORAL_TABLET | Freq: Every day | ORAL | Status: DC

## 2022-03-20 NOTE — Evaluation (Signed)
Occupational Therapy Evaluation Patient Details Name: DENESIA RUSHLOW MRN: [id number] DOB: 1945/08/01 Today's Date: 03/20/2022   History of Present Illness 76 year old female presenting with TIA. PMH of hypertension, diabetes, obesity, and gout.   Clinical Impression   Patient presenting with decreased Ind in self care, functional mobility/transfers, endurance, and safety awareness. Patient reports living at home with son and being independent at baseline. Pt displaying expressive and receptive aphasia during session but able to answer yes/no questions and choice of two well. She is able to verbalized location, month, and name to therapist. Pt stands and transfers to [facility] with min A and is able to void. Pt stands with min A for standing balance to perform hygiene but is unable to follow commands to put paper in toilet and instead drops it on floor. Min A to turn and wash hands at sink but needing max multimodal cuing as pt is having difficulty sequencing, initiating, and problem solving with routine task. Patient holding onto IV pole and follows pole that therapist pushes with min A for balance 10'. Pt returns to bed at end of session. Patient will benefit from acute OT to increase overall independence in the areas of ADLs, functional mobility, and safety awareness in order to safely discharge to next venue of care.      Recommendations for follow up therapy are one component of a multi-disciplinary discharge planning process, led by the attending physician.  Recommendations may be updated based on patient status, additional functional criteria and insurance authorization.   Follow Up Recommendations  Acute inpatient rehab (3hours/day)     Assistance Recommended at Discharge Frequent or constant Supervision/Assistance  Patient can return home with the following A lot of help with walking and/or transfers;A lot of help with bathing/dressing/bathroom;Assistance with cooking/housework;Help with stairs  or ramp for entrance;Assist for transportation;Direct supervision/assist for financial management;Direct supervision/assist for medications management;Assistance with feeding    Functional Status Assessment  Patient has had a recent decline in their functional status and demonstrates the ability to make significant improvements in function in a reasonable and predictable amount of time.  Equipment Recommendations  BSC/3in1;Other (comment) (RW)       Precautions / Restrictions Precautions Precautions: Fall Precaution Comments: seizure Restrictions Weight Bearing Restrictions: No      Mobility Bed Mobility Overal bed mobility: Needs Assistance Bed Mobility: Supine to Sit, Sit to Supine     Supine to sit: Supervision Sit to supine: Supervision   General bed mobility comments: mod multimodal cuing    Transfers Overall transfer level: Needs assistance Equipment used: 1 person hand held assist Transfers: Sit to/from Stand, Bed to chair/wheelchair/BSC Sit to Stand: Min guard     Step pivot transfers: Min assist     General transfer comment: max multimodal cuing      Balance Overall balance assessment: Needs assistance Sitting-balance support: Bilateral upper extremity supported, Feet supported Sitting balance-Leahy Scale: Good     Standing balance support: Bilateral upper extremity supported, During functional activity Standing balance-Leahy Scale: Fair                             ADL either performed or assessed with clinical judgement   ADL Overall ADL's : Needs assistance/impaired                         Toilet Transfer: Minimal assistance;BSC/3in1;Ambulation   Toileting- Clothing Manipulation and Hygiene: Minimal assistance;Sit to/from stand  Functional mobility during ADLs: Minimal assistance       Vision Patient Visual Report: No change from baseline              Pertinent Vitals/Pain Pain Assessment Pain Assessment:  Faces Faces Pain Scale: No hurt     Hand Dominance Right   Extremity/Trunk Assessment Upper Extremity Assessment Upper Extremity Assessment: Generalized weakness   Lower Extremity Assessment Lower Extremity Assessment: Generalized weakness       Communication Communication Communication: Expressive difficulties;Receptive difficulties   Cognition Arousal/Alertness: Awake/alert Behavior During Therapy: Flat affect Overall Cognitive Status: Impaired/Different from baseline Area of Impairment: Awareness, Attention, Following commands, Safety/judgement                   Current Attention Level: Focused   Following Commands: Follows one step commands inconsistently Safety/Judgement: Decreased awareness of safety, Decreased awareness of deficits     General Comments: receptive and expressive difficulties                Home Living Family/patient expects to be discharged to:: Private residence Living Arrangements: Children (son) Available Help at Discharge: Family;Available 24 hours/day Type of Home: House                       Home Equipment: None          Prior Functioning/Environment Prior Level of Function : Independent/Modified Independent               ADLs Comments: Pt reports living independently with son        OT Problem List: Decreased strength;Decreased activity tolerance;Impaired balance (sitting and/or standing);Decreased safety awareness;Decreased knowledge of use of DME or AE;Decreased cognition      OT Treatment/Interventions: Self-care/ADL training;Therapeutic exercise;Therapeutic activities;DME and/or AE instruction;Manual therapy;Balance training;Patient/family education;Energy conservation;Cognitive remediation/compensation    OT Goals(Current goals can be found in the care plan section) Acute Rehab OT Goals Patient Stated Goal: to get better OT Goal Formulation: With patient Time For Goal Achievement:  04/03/22 Potential to Achieve Goals: Good  OT Frequency: Min 2X/week       AM-PAC OT "6 Clicks" Daily Activity     Outcome Measure Help from another person eating meals?: A Little Help from another person taking care of personal grooming?: A Little Help from another person toileting, which includes using toliet, bedpan, or urinal?: A Lot Help from another person bathing (including washing, rinsing, drying)?: A Lot Help from another person to put on and taking off regular upper body clothing?: A Lot Help from another person to put on and taking off regular lower body clothing?: A Lot 6 Click Score: 14   End of Session Equipment Utilized During Treatment: Rolling walker (2 wheels) Nurse Communication: Mobility status  Activity Tolerance: Patient tolerated treatment well Patient left: in bed;with call bell/phone within reach;with bed alarm set  OT Visit Diagnosis: Unsteadiness on feet (R26.81);Repeated falls (R29.6);Muscle weakness (generalized) (M62.81)                Time: 1610-9604 OT Time Calculation (min): 16 min Charges:  OT General Charges $OT Visit: 1 Visit OT Evaluation $OT Eval Moderate Complexity: 1 Mod OT Treatments $Self Care/Home Management : 8-22 mins  Jackquline Denmark, MS, OTR/L , CBIS ascom [phone]  03/20/22, 9:53 AM

## 2022-03-20 NOTE — Discharge Summary (Signed)
Physician Discharge Summary   Patient: Krista Anderson MRN: [id number] DOB: July 12, 1945  Admit date:     03/18/2022  Discharge date: 03/20/22  Discharge Physician: Max Sane   PCP: [facility], [place]   Recommendations at discharge:    [facility] for continuous EEG monitoring  Discharge Diagnoses: Principal Problem:   Seizure [facility]) Active Problems:   Hypertensive urgency   Diabetes mellitus ([place])   Stroke (cerebrum) [facility])   Aphasia  Hospital Course: 76 year old female with a known history of hypertension, diabetes, obesity, gout is admitted for suspected stroke  11/23: Inpatient code stroke/RRT called this morning for slurred speech/dysarthria.  CT head negative.  Neuro reeval, may need EEG and or MRI brain with and without contrast 11/24: PT, OT recommends CIR.  EEG concerning for focal slowing over left hemisphere.  Keppra started per neurology  Assessment and Plan: * Seizure [facility]) Patient presents to the ER for evaluation of slurred speech, right arm numbness which was resolved and was thought to be due to TIA. MRI of the brain without contrast ruled out an acute stroke. She was started on aspirin, Plavix and high intensity statins Patient had a code stroke called on 11/23 by nursing for aphasia/dysarthria -with unknown timing she was not a candidate for TNK.  CTA head and neck negative for large vessel occlusion.  Repeat MRI was negative for stroke.  EEG showed focal slowing over left hemisphere for which she was started on Keppra. 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 - Increased Keppa 100 mg bid - Son is updated and is in agreement with the transfer  Diabetes mellitus ([place]) Diet controlled.  Will check hemoglobin A1c.  Last A1c in the system shows 6.4 back in 2017.  Hypertensive urgency Patient presented to the ER for  evaluation of slurred speech and right arm numbness and was noted to have elevated blood pressure with systolic blood pressure of 210 mmHg Initially her symptoms were thought to be related to hypertensive emergency but blood pressure has improved without any intervention Her blood pressure has improved          Consultants: Neuro Procedures performed: EEG  Disposition:  [place] Diet recommendation:  Discharge Diet Orders (From admission, onward)     Start     Ordered   03/20/22 0000  Diet - low sodium heart healthy        03/20/22 1939           Carb modified diet DISCHARGE MEDICATION: Allergies as of 03/20/2022       Reactions   Amitriptyline Other (See Comments)   Makes me crazy    Elemental Sulfur Nausea Only   Sick on stomach         Medication List     STOP taking these medications    diazepam 5 MG tablet Commonly known as: VALIUM   glucosamine-chondroitin 500-400 MG tablet   naproxen 500 MG tablet Commonly known as: Naprosyn   oxyCODONE-acetaminophen 5-325 MG tablet Commonly known as: PERCOCET/ROXICET   VISION FORMULA PO       TAKE these medications    acetaminophen 500 MG tablet Commonly known as: TYLENOL Take 500 mg by mouth every 6 (six) hours as needed for mild pain.   allopurinol 100 MG tablet Commonly known as: ZYLOPRIM Take 100 mg by mouth daily.   aspirin 81 MG  chewable tablet Chew 81 mg by mouth daily.   clopidogrel 75 MG tablet Commonly known as: PLAVIX Take 1 tablet (75 mg total) by mouth daily. Start taking on: March 21, 2022   Colchicine 0.6 MG Caps Take 0.6 mg by mouth daily as needed (Gout).   cyanocobalamin 1000 MCG tablet Commonly known as: VITAMIN B12 Take 1,000 mcg by mouth daily.   fluticasone 50 MCG/ACT nasal spray Commonly known as: FLONASE Place 1 spray into both nostrils 2 (two) times daily.   gabapentin 300 MG capsule Commonly known as: NEURONTIN Take 300 mg by mouth 3 (three) times  daily.   levETIRAcetam 1000 MG/100ML Soln Commonly known as: KEPPRA Inject 100 mLs (1,000 mg total) into the vein every 12 (twelve) hours.   lisinopril 20 MG tablet Commonly known as: ZESTRIL Take 20 mg by mouth daily.   meloxicam 15 MG tablet Commonly known as: MOBIC Take 15 mg by mouth daily.   polyethylene glycol powder 17 GM/SCOOP powder Commonly known as: GLYCOLAX/MIRALAX Take 17 g by mouth daily as needed for constipation.   pravastatin 80 MG tablet Commonly known as: PRAVACHOL Take 80 mg by mouth at bedtime. What changed: Another medication with the same name was removed. Continue taking this medication, and follow the directions you see here.   Vitamin D-1000 Max St 25 MCG (1000 UT) tablet Generic drug: Cholecalciferol Take 1,000 Units by mouth daily.   Voltaren 1 % Gel Generic drug: diclofenac Sodium Apply 1 application topically daily as needed for pain.        Discharge Exam: Filed Weights   03/18/22 1600  Weight: 51.84 kg   76 year old female sitting in the bed comfortably without any acute distress Lungs clear to auscultation bilaterally Cardiovascular regular rate and rhythm Abdomen soft, benign Neuro patient is alert and awake.  She was able to talk although slow in response.  She does seem to have some expressive aphasia but was able to share location and month.  No focal weakness  Condition at discharge: fair  The results of significant diagnostics from this hospitalization (including imaging, microbiology, ancillary and laboratory) are listed below for reference.   Imaging Studies: MR BRAIN W WO CONTRAST  Result Date: 03/19/2022 CLINICAL DATA:  Stroke suspected or possible seizure; aphasia EXAM: MRI HEAD WITHOUT AND WITH CONTRAST TECHNIQUE: Multiplanar, multiecho pulse sequences of the brain and surrounding structures were obtained without and with intravenous contrast. CONTRAST:  100mL GADAVIST GADOBUTROL 1 MMOL/ML IV SOLN COMPARISON:  03/19/2022  MRI head without contrast; correlation is also made with CT head 03/19/2022 FINDINGS: Brain: No abnormal parenchymal or meningeal enhancement. No restricted diffusion to suggest acute or subacute infarct. No acute hemorrhage, mass, mass effect, or midline shift. No hydrocephalus or extra-axial collection. 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 Advanced cerebral atrophy for age, with ex vacuo dilatation of the lateral and third ventricles. Mega cisterna magna. The hippocampi are symmetric in size and signal. No heterotopia or evidence of cortical dysgenesis. Vascular: Normal arterial flow voids. Normal arterial and venous enhancement. Skull and upper cervical spine: Normal marrow signal. Sinuses/Orbits: Mucous retention cysts in the maxillary sinuses. Otherwise clear paranasal sinuses. No acute finding in the orbits. Other: Trace fluid in the bilateral mastoid air cells. IMPRESSION: No acute intracranial process. No evidence of acute or subacute infarct. No abnormal enhancement. Electronically Signed   By: Merilyn Baba M.D.   On: 03/19/2022 19:33   EEG adult  Result Date:  03/19/2022 Derek Jack, MD     03/19/2022  6:43 PM Routine EEG Report CARSON OPFER is a 76 y.o. female with a history of aphasia who is undergoing an EEG to evaluate for seizures. Report: This EEG was acquired with electrodes placed according to the International 10-20 electrode system (including Fp1, Fp2, F3, F4, C3, C4, P3, P4, O1, O2, T3, T4, T5, T6, A1, A2, Fz, Cz, Pz). The following electrodes were missing or displaced: none. The occipital dominant rhythm was 8.5 Hz. This activity is reactive to stimulation. Drowsiness was manifested by background fragmentation; deeper stages of sleep were identified by K complexes and sleep spindles. There was focal slowing over the left hemisphere in the theta range. There were no interictal epileptiform discharges.  There were no electrographic seizures identified. Photic stimulation and hyperventilation were not performed. Impression and clinical correlation: This EEG was obtained while awake and asleep and is abnormal due to moderate slowing over the left hemisphere indicative of focal cerebral dysfunction in that region. Epileptiform abnormalities were not seen during this recording. Su Monks, MD [facility] [phone] If 7pm- 7am, please page neurology on call as listed in [place].   ECHOCARDIOGRAM COMPLETE  Result Date: 03/19/2022    ECHOCARDIOGRAM REPORT   Patient Name:   LYNDIE FRACK Zagal Date of Exam: 03/19/2022 Medical Rec #:  [id number]   Height:       64.0 in Accession #:    [id number]  Weight:       200.0 lb Date of Birth:  1945-09-23   BSA:          1.956 m Patient Age:    48 years    BP:           151/78 mmHg Patient Gender: F           HR:           73 bpm. Exam Location:  [facility] Procedure: 2D Echo, Color Doppler and Cardiac Doppler Indications:     I63.9 Stroke  History:         Patient has no prior history of Echocardiogram examinations.                  Risk Factors:Hypertension.  Sonographer:     Charmayne Sheer Referring Phys:  [place] Diagnosing Phys: Buford Dresser MD  Sonographer Comments: Suboptimal apical window and suboptimal subcostal window. IMPRESSIONS  1. Left ventricular ejection fraction, by estimation, is 55 to 60%. The left ventricle has normal function. The left ventricle has no regional wall motion abnormalities. There is mild concentric left ventricular hypertrophy. Left ventricular diastolic parameters were normal.  2. Right ventricular systolic function is normal. The right ventricular size is normal.  3. The mitral valve is grossly normal. Trivial mitral valve regurgitation.  4. The aortic valve is tricuspid. There is mild calcification of the aortic valve. There is mild thickening of the aortic valve. Aortic valve regurgitation is not visualized. Aortic  valve sclerosis/calcification is present, without any evidence of aortic stenosis.  5. The inferior vena cava is normal in size with <50% respiratory variability, suggesting right atrial pressure of 8 mmHg. Comparison(s): No prior Echocardiogram. Conclusion(s)/Recommendation(s): Normal biventricular function without evidence of hemodynamically significant valvular heart disease. No intracardiac source of embolism detected on this transthoracic study. Consider a transesophageal echocardiogram to exclude cardiac source of embolism if clinically indicated. FINDINGS  Left Ventricle: Left ventricular ejection fraction, by estimation, is 55 to 60%. The left ventricle has normal function.  The left ventricle has no regional wall motion abnormalities. The left ventricular internal cavity size was normal in size. There is  mild concentric left ventricular hypertrophy. Left ventricular diastolic parameters were normal. Right Ventricle: The right ventricular size is normal. No increase in right ventricular wall thickness. Right ventricular systolic function is normal. Left Atrium: Left atrial size was normal in size. Right Atrium: Right atrial size was normal in size. Pericardium: There is no evidence of pericardial effusion. Presence of epicardial fat layer. Mitral Valve: The mitral valve is grossly normal. Trivial mitral valve regurgitation. Tricuspid Valve: The tricuspid valve is grossly normal. Tricuspid valve regurgitation is trivial. Aortic Valve: The aortic valve is tricuspid. There is mild calcification of the aortic valve. There is mild thickening of the aortic valve. Aortic valve regurgitation is not visualized. Aortic valve sclerosis/calcification is present, without any evidence of aortic stenosis. Aortic valve mean gradient measures 4.0 mmHg. Aortic valve peak gradient measures 8.2 mmHg. Aortic valve area, by VTI measures 1.61 cm. Pulmonic Valve: The pulmonic valve was not well visualized. Pulmonic valve  regurgitation is trivial. No evidence of pulmonic stenosis. Aorta: The aortic root, ascending aorta, aortic arch and descending aorta are all structurally normal, with no evidence of dilitation or obstruction. Venous: The inferior vena cava is normal in size with less than 50% respiratory variability, suggesting right atrial pressure of 8 mmHg. IAS/Shunts: The interatrial septum was not well visualized.  LEFT VENTRICLE PLAX 2D LVIDd:         4.10 cm   Diastology LVIDs:         2.90 cm   LV e' medial:    7.94 cm/s LV PW:         1.20 cm   LV E/e' medial:  12.8 LV IVS:        1.33 cm   LV e' lateral:   7.18 cm/s LVOT diam:     1.70 cm   LV E/e' lateral: 14.2 LV SV:         46 LV SV Index:   24 LVOT Area:     2.27 cm  RIGHT VENTRICLE RV Basal diam:  3.10 cm RV S prime:     17.90 cm/s LEFT ATRIUM             Index        RIGHT ATRIUM           Index LA diam:        3.10 cm 1.58 cm/m   RA Area:     10.90 cm LA Vol (A2C):   21.4 ml 10.94 ml/m  RA Volume:   22.20 ml  11.35 ml/m LA Vol (A4C):   22.4 ml 11.45 ml/m LA Biplane Vol: 23.9 ml 12.22 ml/m  AORTIC VALVE                    PULMONIC VALVE AV Area (Vmax):    1.55 cm     PV Vmax:       1.03 m/s AV Area (Vmean):   1.56 cm     PV Vmean:      70.800 cm/s AV Area (VTI):     1.61 cm     PV VTI:        0.204 m AV Vmax:           143.00 cm/s  PV Peak grad:  4.2 mmHg AV Vmean:          96.200 cm/s  PV Mean  grad:  2.0 mmHg AV VTI:            0.287 m AV Peak Grad:      8.2 mmHg AV Mean Grad:      4.0 mmHg LVOT Vmax:         97.40 cm/s LVOT Vmean:        66.000 cm/s LVOT VTI:          0.204 m LVOT/AV VTI ratio: 0.71  AORTA Ao Root diam: 3.10 cm MITRAL VALVE MV Area (PHT): 4.80 cm     SHUNTS MV Decel Time: 158 msec     Systemic VTI:  0.20 m MV E velocity: 102.00 cm/s  Systemic Diam: 1.70 cm MV A velocity: 142.00 cm/s MV E/A ratio:  0.72 Jodelle Red MD Electronically signed by Jodelle Red MD Signature Date/Time: 03/19/2022/6:35:54 PM    Final    CT  ANGIO HEAD NECK W WO CM W PERF (CODE STROKE)  Result Date: 03/19/2022 CLINICAL DATA:  76 year old female code stroke presentation this morning. EXAM: CT ANGIOGRAPHY HEAD AND NECK CT PERFUSION BRAIN CT VENOGRAM HEAD TECHNIQUE: Multidetector CT imaging of the head and neck was performed using the standard protocol during bolus administration of intravenous contrast. Multiplanar CT image reconstructions and MIPs were obtained to evaluate the vascular anatomy. Carotid stenosis measurements (when applicable) are obtained utilizing NASCET criteria, using the distal internal carotid diameter as the denominator. Multiphase CT imaging of the brain was performed following IV bolus contrast injection. Subsequent parametric perfusion maps were calculated using RAPID software. Venographic phase images of the brain were obtained following the administration of intravenous contrast. Multiplanar reformats and maximum intensity projections were generated. RADIATION DOSE REDUCTION: This exam was performed according to the departmental dose-optimization program which includes automated exposure control, adjustment of the mA and/or kV according to patient size and/or use of iterative reconstruction technique. CONTRAST:  OMNIPAQUE IOHEXOL 350 MG/ML SOLN COMPARISON:  Plain head CT 0822 hours. FINDINGS: CT Brain Perfusion Findings: ASPECTS: 10 CBF (<30%) Volume: 85mL Perfusion (Tmax>6.0s) volume: 35mL No CBF, CBV or T-max abnormal parenchyma detected. Mismatch Volume: Not applicable Infarction Location:Not applicable CTA NECK Skeleton: Mild for age cervical spine degeneration. No acute osseous abnormality identified. Upper chest: Negative. Other neck: 13 mm right thyroid nodule , subcentimeter nodule on the left. Not clinically significant and no follow-up imaging recommended. (Ref: J Am Coll Radiol. 2015 Feb;12(2): 143-50).Other neck soft tissue spaces appear within normal limits. Aortic arch: Calcified aortic atherosclerosis. 3  vessel arch configuration. Right carotid system: Mild brachiocephalic artery soft and calcified plaque without stenosis. Tortuous proximal right CCA with no plaque or stenosis. Soft and calcified plaque at the right ICA origin and bulb with up to 50 % stenosis with respect to the distal vessel. Left carotid system: Little plaque at the left CCA origin but widespread soft and calcified plaque along the medial vessel before the bifurcation. No significant left CCA stenosis. Confluent calcified plaque at the left ICA origin and bulb with up to 50 % stenosis with respect to the distal vessel. Vertebral arteries: Proximal right subclavian artery plaque including involvement of the right vertebral artery origin with mild stenosis (series 9, image 201). Mildly dominant right vertebral otherwise patent to the skull base with no plaque or stenosis. Proximal left subclavian artery plaque without stenosis. Normal left vertebral artery origin. Non dominant left vertebral artery is patent to the skull base with no plaque or stenosis. CTA HEAD Posterior circulation: Dominant right V4 segment with bulky calcified  plaque and moderate to severe stenosis (series 9, image 182. But the vessel remains patent to the vertebrobasilar junction. Non dominant left vertebral V4 segment demonstrates mild plaque, mild stenosis, and patent vertebrobasilar junction. Left PICA origin is patent. Right AICA is patent and may be dominant. Patent basilar artery without stenosis. Patent SCA and PCA origins. Posterior communicating arteries are diminutive or absent. Right PCA branches are within normal limits. Left PCA P1 segment is tortuous and irregular with up to moderate stenosis on series 11, image 49. Left PCA branches are otherwise within normal limits. Anterior circulation: Both ICA siphons are patent. Mild to moderate left siphon calcified plaque most pronounced in the supraclinoid segment. Mild to moderate supraclinoid stenosis on the left.  Similar right siphon calcified plaque with mild to moderate supraclinoid stenosis. A small vessel infundibulum at the right ICA terminus is suspected on series 8, images 112 and 113. A small 3 mm saccular aneurysm is less likely. Patent carotid termini, MCA and ACA origins. Anterior communicating artery and bilateral ACA branches are within normal limits. Left MCA M1 segment and bifurcation are patent with only mild irregularity and stenosis. Left MCA branches are within normal limits. Right MCA M1 segment and bifurcation are patent without stenosis, mild tortuosity. Right MCA branches are within normal limits. CTV HEAD Venous sinuses: Dedicated CT venogram images at 0850 hours. Dural venous sinuses, vein of Galen, internal cerebral veins, cavernous sinuses are enhancing and patent. IJ bulbs are enhancing and patent. Anatomic variants: Mildly dominant right vertebral artery. Review of the MIP images confirms the above findings IMPRESSION: 1. Negative for large vessel occlusion. Negative CT Perfusion. Negative CT Venogram. 2. 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 - moderate stenosis Left PCA P1 segment stenosis. - bilateral carotid bifurcation plaque with up to 50% proximal ICA stenosis. 3. Positive also for a small 3 mm outpouching at the distal Right ICA. But favor incidental vessel infundibulum rather than unruptured saccular aneurysm. Electronically Signed   By: Genevie Ann M.D.   On: 03/19/2022 09:07   CT VENOGRAM HEAD  Result Date: 03/19/2022 CLINICAL DATA:  76 year old female code stroke presentation this morning. EXAM: CT ANGIOGRAPHY HEAD AND NECK CT PERFUSION BRAIN CT VENOGRAM HEAD TECHNIQUE: Multidetector CT imaging of the head and neck was performed using the standard protocol during bolus administration  of intravenous contrast. Multiplanar CT image reconstructions and MIPs were obtained to evaluate the vascular anatomy. Carotid stenosis measurements (when applicable) are obtained utilizing NASCET criteria, using the distal internal carotid diameter as the denominator. Multiphase CT imaging of the brain was performed following IV bolus contrast injection. Subsequent parametric perfusion maps were calculated using RAPID software. Venographic phase images of the brain were obtained following the administration of intravenous contrast. Multiplanar reformats and maximum intensity projections were generated. RADIATION DOSE REDUCTION: This exam was performed according to the departmental dose-optimization program which includes automated exposure control, adjustment of the mA and/or kV according to patient size and/or use of iterative reconstruction technique. CONTRAST:  150mL OMNIPAQUE IOHEXOL 350 MG/ML SOLN COMPARISON:  Plain head CT 0822 hours. FINDINGS: CT Brain Perfusion Findings: ASPECTS: 10 CBF (<30%) Volume: 90mL Perfusion (Tmax>6.0s) volume: 32mL No CBF, CBV or T-max abnormal parenchyma detected. Mismatch Volume: Not applicable Infarction Location:Not applicable CTA NECK Skeleton: Mild for age cervical spine degeneration. No acute osseous abnormality  identified. Upper chest: Negative. Other neck: 13 mm right thyroid nodule , subcentimeter nodule on the left. Not clinically significant and no follow-up imaging recommended. (Ref: J Am Coll Radiol. 2015 Feb;12(2): 143-50).Other neck soft tissue spaces appear within normal limits. Aortic arch: Calcified aortic atherosclerosis. 3 vessel arch configuration. Right carotid system: Mild brachiocephalic artery soft and calcified plaque without stenosis. Tortuous proximal right CCA with no plaque or stenosis. Soft and calcified plaque at the right ICA origin and bulb with up to 50 % stenosis with respect to the distal vessel. Left carotid system: Little plaque at the left CCA  origin but widespread soft and calcified plaque along the medial vessel before the bifurcation. No significant left CCA stenosis. Confluent calcified plaque at the left ICA origin and bulb with up to 50 % stenosis with respect to the distal vessel. Vertebral arteries: Proximal right subclavian artery plaque including involvement of the right vertebral artery origin with mild stenosis (series 9, image 201). Mildly dominant right vertebral otherwise patent to the skull base with no plaque or stenosis. Proximal left subclavian artery plaque without stenosis. Normal left vertebral artery origin. Non dominant left vertebral artery is patent to the skull base with no plaque or stenosis. CTA HEAD Posterior circulation: Dominant right V4 segment with bulky calcified plaque and moderate to severe stenosis (series 9, image 182. But the vessel remains patent to the vertebrobasilar junction. Non dominant left vertebral V4 segment demonstrates mild plaque, mild stenosis, and patent vertebrobasilar junction. Left PICA origin is patent. Right AICA is patent and may be dominant. Patent basilar artery without stenosis. Patent SCA and PCA origins. Posterior communicating arteries are diminutive or absent. Right PCA branches are within normal limits. Left PCA P1 segment is tortuous and irregular with up to moderate stenosis on series 11, image 49. Left PCA branches are otherwise within normal limits. Anterior circulation: Both ICA siphons are patent. Mild to moderate left siphon calcified plaque most pronounced in the supraclinoid segment. Mild to moderate supraclinoid stenosis on the left. Similar right siphon calcified plaque with mild to moderate supraclinoid stenosis. A small vessel infundibulum at the right ICA terminus is suspected on series 8, images 112 and 113. A small 3 mm saccular aneurysm is less likely. Patent carotid termini, MCA and ACA origins. Anterior communicating artery and bilateral ACA branches are within normal  limits. Left MCA M1 segment and bifurcation are patent with only mild irregularity and stenosis. Left MCA branches are within normal limits. Right MCA M1 segment and bifurcation are patent without stenosis, mild tortuosity. Right MCA branches are within normal limits. CTV HEAD Venous sinuses: Dedicated CT venogram images at 0850 hours. Dural venous sinuses, vein of Galen, internal cerebral veins, cavernous sinuses are enhancing and patent. IJ bulbs are enhancing and patent. Anatomic variants: Mildly dominant right vertebral artery. Review of the MIP images confirms the above findings IMPRESSION: 1. Negative for large vessel occlusion. Negative CT Perfusion. Negative CT Venogram. 2. 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 - moderate stenosis Left PCA P1 segment stenosis. - bilateral carotid bifurcation plaque with up to 50% proximal ICA stenosis. 3. Positive also for a small 3 mm outpouching at the distal Right ICA. But favor incidental vessel infundibulum rather than unruptured saccular aneurysm. Electronically Signed   By: Genevie Ann M.D.   On: 03/19/2022 09:07  CT HEAD CODE STROKE WO CONTRAST  Result Date: 03/19/2022 CLINICAL DATA:  Code stroke. Neuro deficit, acute, stroke suspected. Headache. Patient admitted yesterday. Severe aphasia and dysarthria this morning. EXAM: CT HEAD WITHOUT CONTRAST TECHNIQUE: Contiguous axial images were obtained from the base of the skull through the vertex without intravenous contrast. RADIATION DOSE REDUCTION: This exam was performed according to the departmental dose-optimization program which includes automated exposure control, adjustment of the mA and/or kV according to patient size and/or use of iterative reconstruction technique. COMPARISON:  CT head without contrast  03/18/2022. MR head without contrast 03/19/2022. FINDINGS: Brain: No acute infarct, hemorrhage, or mass lesion is present. Stable atrophy and white matter disease. Ventricular enlargement is stable. 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 Remote encephalomalacia of the right temporal tip noted. No significant extraaxial fluid collection is present. The brainstem and cerebellum are within normal limits. Vascular: Atherosclerotic calcifications are again noted within the cavernous internal carotid arteries bilaterally and at the dural margin of both vertebral arteries. No hyperdense vessel is present. Skull: Calvarium is intact. No focal lytic or blastic lesions are present. No significant extracranial soft tissue lesion is present. Sinuses/Orbits: The paranasal sinuses and mastoid air cells are clear. The globes and orbits are within normal limits. ASPECTS [facility] Stroke Program Early CT Score) - Ganglionic level infarction (caudate, lentiform nuclei, internal capsule, insula, M1-M3 cortex): 7/7 - Supraganglionic infarction (M4-M6 cortex): 3/3 Total score (0-10 with 10 being normal): 10/10 IMPRESSION: 1. No acute intracranial abnormality or significant interval change. 2. Stable atrophy and white matter disease. This likely reflects the sequela of chronic microvascular ischemia. 3. 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 4. Remote encephalomalacia of the right temporal tip. 5. Aspects is 10/10. The above was relayed via text pager to Dr. Quinn Axe On 03/19/2022 at 08:27 . Electronically Signed   By: San Morelle M.D.   On: 03/19/2022 08:28   MR BRAIN WO CONTRAST  Result Date: 03/19/2022 CLINICAL DATA:  Some onset dizziness, slurred speech, and right arm numbness EXAM: MRI HEAD WITHOUT CONTRAST TECHNIQUE: Multiplanar, multiecho pulse sequences of the brain and surrounding structures were obtained without intravenous contrast. COMPARISON:   03/04/2020 MRI head, correlation is also made with 03/18/2022 CT head FINDINGS: Brain: No restricted diffusion to suggest acute or subacute infarct. No acute hemorrhage, mass, mass effect, midline shift. No hydrocephalus or extra-axial collection. 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 Redemonstrated encephalomalacia in the anterior right temporal lobe. Advanced cerebral atrophy for age, with ex vacuo dilatation of lateral and third ventricles. Redemonstrated mega cisterna magna. Vascular: Normal arterial flow voids. Skull and upper cervical spine: Normal marrow signal. Sinuses/Orbits: Mucous retention cysts in the maxillary sinuses. The orbits are unremarkable. Other: Trace fluid in the bilateral mastoid air cells. IMPRESSION: No acute intracranial process. No evidence of acute or subacute infarct. Electronically Signed   By: Merilyn Baba M.D.   On: 03/19/2022 01:27   CT HEAD WO CONTRAST (5MM)  Result Date: 03/18/2022 CLINICAL DATA:  Acute neurological deficit. Stroke suspected. Patient was not making sense while talking to the family. EXAM: CT HEAD WITHOUT CONTRAST TECHNIQUE: Contiguous axial images were obtained from the base of the skull through the vertex without intravenous contrast. RADIATION DOSE REDUCTION: This exam was performed according to the departmental dose-optimization program which includes automated exposure control, adjustment of the mA and/or kV according to patient size and/or use of iterative reconstruction technique. COMPARISON:  MRI brain 03/04/2020.  CT temporal bones 10/07/2018 FINDINGS: Brain: Diffuse cerebral atrophy. Prominent ventricular dilatation is similar to prior study and likely represents central atrophy. Prominent cisterna magna. No mass-effect or midline shift. No abnormal extra-axial fluid collections. Gray-white matter junctions are distinct. Old lacunar infarcts in the left basal ganglia and internal capsule. Basal cisterns are not effaced. No  acute intracranial hemorrhage. Vascular: Vascular calcifications.  No acute changes. Skull: Calvarium appears intact. Sinuses/Orbits: Paranasal sinuses and mastoid air cells are clear. Other: None. IMPRESSION: 1. No acute intracranial abnormalities. 2. Chronic atrophy and small vessel ischemic changes. Electronically Signed   By: Lucienne Capers M.D.   On: 03/18/2022 15:57    Microbiology: Results for orders placed or performed during the hospital encounter of 02/21/16  Surgical pcr screen     Status: None   Collection Time: 02/21/16  9:43 AM   Specimen: Nasal Mucosa; Nasal Swab  Result Value Ref Range Status   MRSA, PCR NEGATIVE NEGATIVE Final   Staphylococcus aureus NEGATIVE NEGATIVE Final    Comment:        The Xpert SA Assay (FDA approved for NASAL specimens in patients over 64 years of age), is one component of a comprehensive surveillance program.  Test performance has been validated by [facility] for patients greater than or equal to 12 year old. It is not intended to diagnose infection nor to guide or monitor treatment.     Labs: CBC: Recent Labs  Lab 03/18/22 1541 03/20/22 0627  WBC 6.7 7.8  NEUTROABS 3.0  --   HGB 13.9 12.6  HCT 42.6 38.6  MCV 89.9 89.8  PLT 121* [id number]*   Basic Metabolic Panel: Recent Labs  Lab 03/18/22 1541 03/20/22 0627  NA 137 138  K 3.6 3.6  CL 104 109  CO2 25 24  GLUCOSE 107* 117*  BUN 17 12  CREATININE 1.06* 0.75  CALCIUM 9.3 8.6*   Liver Function Tests: Recent Labs  Lab 03/18/22 1541  AST 22  ALT 18  ALKPHOS 57  BILITOT 0.9  PROT 7.3  ALBUMIN 4.0   CBG: No results for input(s): "GLUCAP" in the last 168 hours.  Discharge time spent: greater than 30 minutes.  Signed: Max Sane, MD [facility] 03/20/2022

## 2022-03-20 NOTE — Plan of Care (Signed)
Patient has complained of headaches today, given tylenol per PRN order. Patient has been OOB to bathroom with no issues. Patient has fluctuating aphasia MD is aware. Awaiting bed at [facility] for continuous EEG monitoring. Patient son Krista Anderson [phone] wants to be called once patient has a bed

## 2022-03-20 NOTE — Progress Notes (Signed)
S: Patient's aphasia improved this AM and worsened later in the afternoon. 2nd MRI brain also neg for infarct or other acute process (personal review). rEEG showed L hemispheric slowing w/o epileptiform abnl  O:  Vitals:   03/20/22 1214 03/20/22 1702  BP: 138/63 135/67  Pulse: 63 65  Resp: 18 15  Temp: 98 F (36.7 C) 98.2 F (36.8 C)  SpO2: 98% 98%   Physical Exam Gen: Alert, perseverates on the word "yes," unable to answer orientation questions or follow most commands but can mimic HEENT: Atraumatic, normocephalic;mucous membranes moist; oropharynx clear, tongue without atrophy or fasciculations. Neck: Supple, trachea midline. Resp: CTAB, no w/r/r CV: RRR, no m/g/r; nml S1 and S2. 2+ symmetric peripheral pulses. Abd: soft/NT/ND; nabs x 4 quad Extrem: Nml bulk; no cyanosis, clubbing, or edema.   Neuro: *MS: Alert, perseverates on the word "yes," unable to answer orientation questions or follow most commands but can mimic *Speech: mild dysarthria, when asked to name objects she only can repeat "that's supposed to be" *CN:    I: Deferred   II,III: PERRLA, blinks to threat bilat, optic discs unable to be visualized 2/2 pupillary constriction   III,IV,VI: EOMI w/o nystagmus, no ptosis   V: Sensation intact from V1 to V3 to LT   VII: Eyelid closure was full. Face symmetric   VIII: Hearing intact to voice   IX,X: Voice normal, palate elevates symmetrically    XI: SCM/trap 5/5 bilat   XII: Tongue protrudes midline, no atrophy or fasciculations  *Motor:   Normal bulk.  No tremor, rigidity or bradykinesia. Moves all extremities spontaneously w/o e/o focal weakness *Sensory: SILT *Coordination:  UTA *Reflexes:  2+ and symmetric throughout without clonus; toes down-going bilat *Gait: deferred  A/P: 76yo woman with hx DM2 and HTN admitted 11/22 for aphasia that resolved prior to arrival that was felt to be TIA. MRI brain was neg for acute infarct. The following day she underwent stroke  code for acute onset aphasia with unremarkable CT/CTA f/b repeat MRI brain (this time with and without contrast) that was again negative for acute infarct or other significant abnl. 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 rEEG showed L hemispheric slowing without epileptiform abnl. 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 She has no known seizure hx.  - Transfer to [facility] for cEEG - Increase keppra to 1000mg  q 12 hrs - Seizure precautions - Will continue to follow until she is transferred to [facility]  [facility], MD [facility] [phone]  If 7pm- 7am, please page neurology on call as listed in AMION.

## 2022-03-20 NOTE — Assessment & Plan Note (Signed)
Patient presented to the ER for evaluation of slurred speech and right arm numbness and was noted to have elevated blood pressure with systolic blood pressure of 210 mmHg Initially her symptoms were thought to be related to hypertensive emergency but blood pressure has improved without any intervention Her blood pressure has improved

## 2022-03-20 NOTE — Progress Notes (Signed)
Physical Therapy Treatment Patient Details Name: Krista Anderson MRN: [id number] DOB: [birth year] Today's Date: 03/20/2022   History of Present Illness Pt is a 76 y/o F admitted on 03/18/22 after presenting to the ER for evaluation of sudden onset dizziness, slurred speech, & RUE numbness. Pt was initially treated as code stroke but that was cancelled as pt's symptoms appeared to be due to hypertensive emergency. Pt's slurred speech & RUE numbness resolved until code stroke called again on 11/23 as nursing observed pt to have aphasia/dysarthria. All CTs & MRIs are negative for acute processes thus far. Neurology has been re-consulted, plans for EEG. PMH: HTN, diet controlled DM, obesity, gout    PT Comments    Pt seen for PT tx with pt agreeable despite c/o HA. Pt is able to complete bed mobility without physical assistance but requires CGA<>Min assist for STS, and up to mod assist for gait. Pt initially ambulates with BUE on IV Pole for balance with min assist, but during 2nd ambulation trial pt ambulates without BUE support with min<>mod assist. Pt demonstrates decreased LUE reciprocal arm swing, decreased sweeping gaze & keeps head rotated slightly to the R. Gait distance is limited by overall fatigue & pt requesting to get hair washed at end of session. Pt asking questions re: situation with PT answering to the best of my ability. Pt remains a good CIR candidate as pt would benefit from intensive rehab to help facilitate return to PLOF.    Recommendations for follow up therapy are one component of a multi-disciplinary discharge planning process, led by the attending physician.  Recommendations may be updated based on patient status, additional functional criteria and insurance authorization.  Follow Up Recommendations  Acute inpatient rehab (3hours/day)     Assistance Recommended at Discharge Frequent or constant Supervision/Assistance  Patient can return home with the following A lot of help with  walking and/or transfers;A lot of help with bathing/dressing/bathroom;Assistance with feeding;Assistance with cooking/housework;Direct supervision/assist for medications management;Help with stairs or ramp for entrance;Assist for transportation   Equipment Recommendations  Rolling walker (2 wheels);BSC/3in1    Recommendations for Other Services       Precautions / Restrictions Precautions Precautions: Fall Precaution Comments: seizure Restrictions Weight Bearing Restrictions: No     Mobility  Bed Mobility Overal bed mobility: Needs Assistance Bed Mobility: Supine to Sit     Supine to sit: Supervision, HOB elevated          Transfers Overall transfer level: Needs assistance Equipment used: 1 person hand held assist Transfers: Sit to/from Stand Sit to Stand: Min guard, Min assist                Ambulation/Gait Ambulation/Gait assistance: Min assist, Mod assist Gait Distance (Feet): 70 Feet (90 ft) Assistive device: None, IV Pole Gait Pattern/deviations: Decreased step length - right, Decreased step length - left, Decreased stride length, Decreased dorsiflexion - right, Decreased dorsiflexion - left Gait velocity: decreased         Stairs             Wheelchair Mobility    Modified Rankin (Stroke Patients Only)       Balance Overall balance assessment: Needs assistance Sitting-balance support: Bilateral upper extremity supported, Feet supported Sitting balance-Leahy Scale: Good Sitting balance - Comments: supervision static sitting EOB   Standing balance support: No upper extremity supported, During functional activity Standing balance-Leahy Scale: Poor Standing balance comment: Pt with 1 LOB when initiating gait with pt returning to sit EOB with  assistance for safety.                            Cognition Arousal/Alertness: Awake/alert Behavior During Therapy: Flat affect Overall Cognitive Status: Impaired/Different from  baseline Area of Impairment: Awareness, Attention, Following commands, Safety/judgement                       Following Commands: Follows one step commands inconsistently Safety/Judgement: Decreased awareness of safety, Decreased awareness of deficits     General Comments: receptive and expressive difficulties        Exercises      General Comments General comments (skin integrity, edema, etc.): HR 76 bpm after gait      Pertinent Vitals/Pain Pain Assessment Pain Assessment: Faces Faces Pain Scale: Hurts even more Pain Location: HA Pain Descriptors / Indicators: Headache Pain Intervention(s): Monitored during session    Home Living Family/patient expects to be discharged to:: Private residence Living Arrangements: Children (son) Available Help at Discharge: Family;Available 24 hours/day Type of Home: House           Home Equipment: None      Prior Function            PT Goals (current goals can now be found in the care plan section) Acute Rehab PT Goals PT Goal Formulation: Patient unable to participate in goal setting Time For Goal Achievement: 04/02/22 Potential to Achieve Goals: Good Progress towards PT goals: Progressing toward goals    Frequency    7X/week      PT Plan Frequency needs to be updated    Co-evaluation              AM-PAC PT "6 Clicks" Mobility   Outcome Measure  Help needed turning from your back to your side while in a flat bed without using bedrails?: None Help needed moving from lying on your back to sitting on the side of a flat bed without using bedrails?: A Little Help needed moving to and from a bed to a chair (including a wheelchair)?: A Little Help needed standing up from a chair using your arms (e.g., wheelchair or bedside chair)?: A Little Help needed to walk in hospital room?: A Lot Help needed climbing 3-5 steps with a railing? : Total 6 Click Score: 16    End of Session   Activity Tolerance:  Patient tolerated treatment well;Patient limited by fatigue Patient left:  (sitting EOB in care of NT)   PT Visit Diagnosis: Unsteadiness on feet (R26.81);Other abnormalities of gait and mobility (R26.89);Difficulty in walking, not elsewhere classified (R26.2);Other symptoms and signs involving the nervous system (R29.898);Muscle weakness (generalized) (M62.81)     Time: 5883-2549 PT Time Calculation (min) (ACUTE ONLY): 13 min  Charges:  $Neuromuscular Re-education: 8-22 mins                     Aleda Grana, PT, DPT 03/20/22, 11:25 AM   Sandi Mariscal 03/20/2022, 11:23 AM

## 2022-03-20 NOTE — Evaluation (Signed)
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 female with medical history significant for hypertension, diet-controlled diabetes mellitus, obesity, gout who was brought into the ER by EMS for evaluation of sudden onset dizziness, slurred speech and right arm numbness on 11/22. Speech difference had resolved as of time of Speech Therapy screen on 11/22. EEG completed on 11/23, revealing "abnormal due to moderate slowing over the left hemisphere indicative of focal cerebral dysfunction in that region. Epileptiform abnormalities were not seen during this recording." MRI 03/19/22: No acute intracranial process. No evidence of acute or subacute  infarct. No abnormal enhancement.   Assessment / Plan / Recommendation Clinical Impression  Pt seen for cognitive linguistic assessment in the setting of EEG concerning for focal slowing over left hemisphere. Pt presents with cognitive communication below her baseline as indicated by patient interview and portions of the Mini Mental State Examination. Significant expressive language deficits noted, c/b phonemic paraphasias, anomia, and perseveration. Relative strength in automatic speech and  simple/general conversation. Verbal expression aided by binary prompts and extended time. Receptive language appeared relatively function for simple questions and commands. Attention bolstered with min redirection and repetition in the setting of distraction. Reduced safety awareness reported by NSG. Confounding factors for assessment include headache pain (NSG aware) and fatigue. Further assessment is indicated.    SLP Assessment  SLP Recommendation/Assessment: Patient needs continued Speech Lanaguage Pathology Services SLP Visit Diagnosis: Cognitive communication deficit (R41.841)    Recommendations for follow up therapy are one component of a multi-disciplinary discharge planning process, led by the attending physician.  Recommendations may be updated based on patient status, additional functional criteria and insurance authorization.    Follow Up Recommendations  Skilled nursing-short term rehab (<3 hours/day)    Assistance Recommended at Discharge   (TBD)  Functional Status Assessment Patient has had a recent decline in their functional status and demonstrates the ability to make significant improvements in function in a reasonable and predictable amount of time.  Frequency and Duration min 2x/week  1 week      SLP Evaluation Cognition  Overall Cognitive Status: Impaired/Different from baseline Arousal/Alertness: Awake/alert Orientation Level: Oriented to person;Disoriented to place;Disoriented to time Attention: Sustained Sustained Attention: Impaired Sustained Attention Impairment: Verbal basic Memory:  (needs further assessment) Awareness:  (immerging- pt reporting that she cant get the word out) Problem Solving:  (needs further assessment) Executive Function:  (needs further assessment) Safety/Judgment: Impaired Comments: RN reporting pt frequently exciting bed. Pt identified call bell independently.       Comprehension  Auditory Comprehension Overall Auditory  Comprehension: Appears within functional limits for tasks assessed Yes/No Questions: Within Functional Limits Commands: Within Functional Limits (one step  simple) Conversation: Simple Interfering Components: Pain Visual Recognition/Discrimination Discrimination: Not tested Reading Comprehension Reading Status: Not tested    Expression Expression Primary Mode of Expression: Verbal Verbal Expression Overall Verbal Expression: Impaired Initiation: No impairment Automatic Speech: Social Response;Name Level of Generative/Spontaneous Verbalization: Sentence Naming: Impairment Responsive: Not tested Confrontation: Impaired: 0/4 Convergent: Not tested Divergent: Not tested Other Naming Comments: generative naming impairment Verbal Errors: Perseveration;Phonemic paraphasias;Aware of errors;Language of confusion Pragmatics: Unable to assess Effective Techniques: Open ended questions;Sentence completion Non-Verbal Means of Communication: Gestures Written Expression Written Expression: Not tested   Oral / Motor  Oral Motor/Sensory Function Overall Oral Motor/Sensory Function: Within functional limits Motor Speech Overall Motor Speech: Appears within functional limits for tasks assessed           [place] Doye Montilla Clapp  MS [facility] SLP   [place] J Clapp 03/20/2022, 5:59 PM

## 2022-03-20 NOTE — Progress Notes (Signed)
Inpatient Rehab Admissions Coordinator:   Per therapy recommendations, patient was screened for CIR candidacy by Megan Salon, MS, CCC-SLP. At this time, Pt. does not appear to demonstrate medical necessity to justify in hospital rehabilitation/CIR, as 2 MRIs have been clear. Note abnormal EEG, but I do not think seizure dx would be sufficient dx to obtain insurance auth. I will not pursue a rehab consult for this Pt.   Recommend other rehab venues to be pursued.  Please contact me with any questions.   Megan Salon, MS, CCC-SLP Rehab Admissions Coordinator  [phone] (celll) [phone] (office)

## 2022-03-20 NOTE — Progress Notes (Signed)
Pt transferred to [facility], report given to primary RN, relative made aware of transfer. Belonging packed and transported with pt. Pt left unit via stretcher with [facility]link. Denies any request at this time.

## 2022-03-20 NOTE — TOC Progression Note (Addendum)
Transition of Care [facility]) - Progression Note    Patient Details  Name: Krista Anderson MRN: [id number] Date of Birth: 01-09-1946  Transition of Care [facility]) CM/SW Contact  Tempie Hoist, [place] Phone Number: 03/20/2022, 4:12 PM  Clinical Narrative:     TOC spoke to Megan Salon [phone]. This patient does not meet criteria for [facility] and will need a workup for SNF rehab.   As per the provider, the patient will transfer to [facility] for further care.    Expected Discharge Plan and Services          SNF                                       Social Determinants of Health (SDOH) Interventions    Readmission Risk Interventions     No data to display

## 2022-03-20 NOTE — Progress Notes (Signed)
  Progress Note   Patient: Krista Anderson HYQ:[id number] DOB: Jul 12, 1945 DOA: 03/18/2022     1 DOS: the patient was seen and examined on 03/20/2022   Brief hospital course: 76 year old female with a known history of hypertension, diabetes, obesity, gout is admitted for suspected stroke  11/23: Inpatient code stroke/RRT called this morning for slurred speech/dysarthria.  CT head negative.  Neuro reeval, may need EEG and or MRI brain with and without contrast 11/24: PT, OT recommends CIR.  EEG concerning for focal slowing over left hemisphere.  Keppra started per neurology  Assessment and Plan: * Seizure [facility]) Patient presents to the ER for evaluation of slurred speech, right arm numbness which was resolved. MRI of the brain without contrast ruled out an acute stroke. Neurology recommends aspirin, Plavix and high intensity statins -continue  -Pending 2D echocardiogram to assess LVEF and rule out cardiac thrombus PT/OT eval pending.  ST eval-no needs identified and they signed off  Patient had a code stroke called on 11/23 by nursing for aphasia/dysarthria -with unknown timing she was not a candidate for TNK.  CTA head and neck negative for large vessel occlusion.  Repeat MRI was negative for stroke.  EEG showed focal slowing over left hemisphere.  Keppra started per neurology and dose may have to be increased as needed  Diabetes mellitus (HCC) Diet controlled.  Will check hemoglobin A1c.  Last A1c in the system shows 6.4 back in 2017.   Hypertensive urgency Patient presented to the ER for evaluation of slurred speech and right arm numbness and was noted to have elevated blood pressure with systolic blood pressure of 210 mmHg Initially her symptoms were thought to be related to hypertensive emergency but blood pressure has improved without any intervention Her blood pressure has improved         Subjective: Patient was slow but talking.  She understood she likely had seizure  Physical  Exam: Vitals:   03/20/22 0053 03/20/22 0546 03/20/22 0750 03/20/22 1214  BP: 114/65 130/61 124/74 138/63  Pulse: 68 65 61 63  Resp: 18 18 18 18   Temp: 98.9 F (37.2 C) 98.5 F (36.9 C) 98.8 F (37.1 C) 98 F (36.7 C)  TempSrc: Oral Oral    SpO2: 95% 96% 96% 98%  Weight:      Height:       76 year old female sitting in the bed comfortably without any acute distress Lungs clear to auscultation bilaterally Cardiovascular regular rate and rhythm Abdomen soft, benign Neuro patient is alert and awake.  She was able to talk although slow in response.  She does seem to have some expressive aphasia but was able to share location and month.  No focal weakness Data Reviewed:  EEG showed focal slowing over the left hemisphere  Family Communication: None  Disposition: Status is: Inpatient Remains inpatient appropriate because: Seizure management.  CIR recommended by PT and OT  Planned Discharge Destination: Rehab   DVT prophylaxis-SCDs Time spent: 35 minutes  Author: 62, MD 03/20/2022 4:03 PM  For on call review [email or web address].

## 2022-03-20 NOTE — Assessment & Plan Note (Addendum)
Patient presents to the ER for evaluation of slurred speech, right arm numbness which was resolved. MRI of the brain without contrast ruled out an acute stroke. Neurology recommends aspirin, Plavix and high intensity statins -continue  -Pending 2D echocardiogram to assess LVEF and rule out cardiac thrombus PT/OT eval pending.  ST eval-no needs identified and they signed off  Patient had a code stroke called on 11/23 by nursing for aphasia/dysarthria -with unknown timing she was not a candidate for TNK.  CTA head and neck negative for large vessel occlusion.  Repeat MRI was negative for stroke.  EEG showed focal slowing over left hemisphere.  Keppra started per neurology and dose may have to be increased as needed

## 2022-03-20 NOTE — Assessment & Plan Note (Signed)
Diet controlled.  Will check hemoglobin A1c.  Last A1c in the system shows 6.4 back in 2017.

## 2022-03-21 ENCOUNTER — Inpatient Hospital Stay (HOSPITAL_COMMUNITY): Payer: Medicare Other

## 2022-03-21 DIAGNOSIS — M109 Gout, unspecified: Secondary | ICD-10-CM | POA: Diagnosis present

## 2022-03-21 DIAGNOSIS — G459 Transient cerebral ischemic attack, unspecified: Secondary | ICD-10-CM | POA: Insufficient documentation

## 2022-03-21 DIAGNOSIS — E119 Type 2 diabetes mellitus without complications: Secondary | ICD-10-CM | POA: Diagnosis present

## 2022-03-21 DIAGNOSIS — E785 Hyperlipidemia, unspecified: Secondary | ICD-10-CM | POA: Diagnosis present

## 2022-03-21 DIAGNOSIS — R569 Unspecified convulsions: Secondary | ICD-10-CM

## 2022-03-21 DIAGNOSIS — D696 Thrombocytopenia, unspecified: Secondary | ICD-10-CM

## 2022-03-21 DIAGNOSIS — Z7982 Long term (current) use of aspirin: Secondary | ICD-10-CM | POA: Diagnosis not present

## 2022-03-21 DIAGNOSIS — R4701 Aphasia: Secondary | ICD-10-CM | POA: Diagnosis present

## 2022-03-21 DIAGNOSIS — Z90710 Acquired absence of both cervix and uterus: Secondary | ICD-10-CM | POA: Diagnosis not present

## 2022-03-21 DIAGNOSIS — R471 Dysarthria and anarthria: Secondary | ICD-10-CM | POA: Diagnosis present

## 2022-03-21 DIAGNOSIS — Z8673 Personal history of transient ischemic attack (TIA), and cerebral infarction without residual deficits: Secondary | ICD-10-CM | POA: Diagnosis not present

## 2022-03-21 DIAGNOSIS — E669 Obesity, unspecified: Secondary | ICD-10-CM | POA: Diagnosis present

## 2022-03-21 DIAGNOSIS — Z79899 Other long term (current) drug therapy: Secondary | ICD-10-CM | POA: Diagnosis not present

## 2022-03-21 DIAGNOSIS — Z6835 Body mass index (BMI) 35.0-35.9, adult: Secondary | ICD-10-CM | POA: Diagnosis not present

## 2022-03-21 DIAGNOSIS — G4089 Other seizures: Secondary | ICD-10-CM | POA: Diagnosis present

## 2022-03-21 DIAGNOSIS — I10 Essential (primary) hypertension: Secondary | ICD-10-CM | POA: Diagnosis present

## 2022-03-21 LAB — CBC
HCT: 39.4 % (ref 36.0–46.0)
Hemoglobin: 13.1 g/dL (ref 12.0–15.0)
MCH: 30.3 pg (ref 26.0–34.0)
MCHC: 33.2 g/dL (ref 30.0–36.0)
MCV: 91 fL (ref 80.0–100.0)
Platelets: 106 10*3/uL — ABNORMAL LOW (ref 150–400)
RBC: 4.33 MIL/uL (ref 3.87–5.11)
RDW: 13.8 % (ref 11.5–15.5)
WBC: 8.2 10*3/uL (ref 4.0–10.5)
nRBC: 0 % (ref 0.0–0.2)

## 2022-03-21 MED ORDER — ORAL CARE MOUTH RINSE: 15.0000 mL | OROMUCOSAL | Status: DC | PRN

## 2022-03-21 MED ORDER — ALLOPURINOL 100 MG PO TABS
100.0000 mg | ORAL_TABLET | Freq: Every day | ORAL | Status: DC
  Administered 2022-03-21 – 2022-03-22 (×2): 100 mg via ORAL
  Filled 2022-03-21 (×2): qty 1

## 2022-03-21 MED ORDER — ENOXAPARIN SODIUM 40 MG/0.4ML IJ SOSY
40.0000 mg | PREFILLED_SYRINGE | INTRAMUSCULAR | Status: DC
  Administered 2022-03-21 – 2022-03-22 (×2): 40 mg via SUBCUTANEOUS
  Filled 2022-03-21: qty 0.4

## 2022-03-21 MED ORDER — ENOXAPARIN SODIUM 40 MG/0.4ML IJ SOSY: 40.0000 mg | PREFILLED_SYRINGE | INTRAMUSCULAR | Status: DC

## 2022-03-21 MED ORDER — CLOPIDOGREL BISULFATE 75 MG PO TABS
75.0000 mg | ORAL_TABLET | Freq: Every day | ORAL | Status: DC
  Administered 2022-03-21: 75 mg via ORAL
  Filled 2022-03-21: qty 1

## 2022-03-21 MED ORDER — ACETAMINOPHEN 650 MG RE SUPP: 650.0000 mg | Freq: Four times a day (QID) | RECTAL | Status: DC | PRN

## 2022-03-21 MED ORDER — PRAVASTATIN SODIUM 40 MG PO TABS
80.0000 mg | ORAL_TABLET | Freq: Every day | ORAL | Status: DC
  Administered 2022-03-21: 80 mg via ORAL
  Filled 2022-03-21: qty 2

## 2022-03-21 MED ORDER — LISINOPRIL 20 MG PO TABS
20.0000 mg | ORAL_TABLET | Freq: Every day | ORAL | Status: DC
  Administered 2022-03-21 – 2022-03-22 (×2): 20 mg via ORAL
  Filled 2022-03-21 (×2): qty 1

## 2022-03-21 MED ORDER — LEVETIRACETAM IN NACL 1000 MG/100ML IV SOLN
1000.0000 mg | Freq: Two times a day (BID) | INTRAVENOUS | Status: DC
  Administered 2022-03-21 (×2): 1000 mg via INTRAVENOUS
  Filled 2022-03-21 (×2): qty 100

## 2022-03-21 MED ORDER — ACETAMINOPHEN 325 MG PO TABS
650.0000 mg | ORAL_TABLET | Freq: Four times a day (QID) | ORAL | Status: DC | PRN
  Administered 2022-03-21: 650 mg via ORAL
  Filled 2022-03-21: qty 2

## 2022-03-21 MED ORDER — ASPIRIN 81 MG PO CHEW
81.0000 mg | CHEWABLE_TABLET | Freq: Every day | ORAL | Status: DC
  Administered 2022-03-21 – 2022-03-22 (×2): 81 mg via ORAL
  Filled 2022-03-21 (×2): qty 1

## 2022-03-21 MED ORDER — LEVETIRACETAM 500 MG PO TABS
1000.0000 mg | ORAL_TABLET | Freq: Two times a day (BID) | ORAL | Status: DC
  Administered 2022-03-21 – 2022-03-22 (×2): 1000 mg via ORAL
  Filled 2022-03-21 (×2): qty 2

## 2022-03-21 NOTE — Progress Notes (Signed)
PT Cancellation Note  Patient Details Name: Krista Anderson MRN: [id number] DOB: August 10, 1945   Cancelled Treatment:    Reason Eval/Treat Not Completed: Patient at procedure or test/unavailable  Patient undergoing EEG. Will see when completed.    Jerolyn Center, PT Acute Rehabilitation Services  Office [phone]   Krista Anderson 03/21/2022, 7:39 AM

## 2022-03-21 NOTE — Progress Notes (Signed)
Neurology Progress Note  Interval History: No family at bedside.  Pt doing well today.  She does not feel like there have been any new episodes today, but no one at bedside to confirm.  She was able to follow all of my commands, doing well.  Still concern for seizure activity.  Will keep on LTM another 24hr.    - noted brief behavioral arrest during exam.  Attempting to do visual field test and mid test she suddenly wouldn't follow my command and seemed confused.  Switched to naming objects, which she was able to respond quickly and correctly.  Then I went back to attempt the visual field test, which she did fine, normal.  Unsure if this was one of her 'episodes' or just poor concentration.   Objective: Vitals:   03/21/22 0336 03/21/22 0825  BP: (!) 140/61 (!) 167/71  Pulse: 65 64  Resp: 14 14  Temp: 98.2 F (36.8 C) 98.1 F (36.7 C)  SpO2: 97% 97%   Physical Exam  Constitutional: Appears well-developed and well-nourished.   Cardiovascular: Normal rate and regular rhythm.  Respiratory: Effort normal, non-labored breathing  Neuro: Mental Status: Patient is awake, alert, oriented to person, place, year, and situation. Patient is able to give a clear and coherent history. No signs of aphasia or neglect    Cranial Nerves: II: Visual Fields are full. Pupils are equal, round, and reactive to light.   III,IV, VI: EOMI without ptosis or diploplia.  V: Facial sensation is symmetric to temperature VII: Facial movement is symmetric resting and smiling VIII: Hearing is intact to voice X: Palate elevates symmetrically XI: Shoulder shrug is symmetric. XII: Tongue protrudes midline without atrophy or fasciculations.  Motor: Tone is normal. Bulk is normal. 5/5 strength was present in all four extremities.  Sensory: Sensation is symmetric to light touch and temperature in the arms and legs. No extinction to DSS present.  Deep Tendon Reflexes: 2+ and symmetric in the biceps and patellae.   Plantars: Toes are downgoing bilaterally.  Cerebellar: FNF and HKS are intact bilaterally ____________________________________________________  Pertinent Labs: Improving known kidney disease Creat 1.06, BUN 17, GFR 55  ->  Creat 0.75, BUN 12, GFR >60 Ethyl <10 A1C 5.9 Lipids - normal  CT Head w/o 03/18/22: no acute abnormality unremarkable, chronic atrophy and vascular disease  3. 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 4. Remote encephalomalacia of the right temporal tip. 5. Aspects is 10/10.  CTA Head/Neck with contrast 03/19/22: LVO (-) 1. Negative for large vessel occlusion. Negative CT Perfusion. Negative CT Venogram. 2. 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 - moderate stenosis Left PCA P1 segment stenosis. - bilateral carotid bifurcation plaque with up to 50% proximal ICA stenosis. 3. Positive also for a small 3 mm outpouching at the distal Right ICA. But favor incidental vessel infundibulum rather than unruptured saccular aneurysm.  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 Advanced cerebral atrophy for age, with ex vacuo dilatation of the lateral and third ventricles. Mega cisterna magna.  ECHO 03/19/22: LV EF 55-60%  rEEG 03/19/22: Slowing over left hemisphere-no seizures.   vEEG 03/21/22: unremarkable, no epileptiform activity, keeping on  another evening  _____________________________________________________________  Assessment and Plan: 76yo woman with hx DM2 and HTN admitted 11/22 for aphasia that resolved prior to arrival that  was felt to be TIA. MRI brain was neg for acute infarct. The following day she underwent stroke code for acute onset aphasia with unremarkable CT/CTA f/b repeat MRI brain (this time with and without contrast) that was again negative for acute infarct or other significant abnl. 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 rEEG showed L hemispheric slowing without epileptiform abnl. 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 She has no known seizure hx.  Acute CVA ruled out LTM EEG unremarkable Continues to have some amount of expressive aphasia-although seems to be somewhat confused this morning  Waxing/waning aphasia Recommendations: - continue keppra 1000mg  q 12 hrs, switch to PO - continue vEEG - Seizure precautions -Discontinue Plavix  ___________________________________________________________  Patient seen and examined by NP with MD. MD to update note as needed.   , AGNP Neurology Hospitalist  To contact Stroke Continuity provider, please refer to Allena Napoleon. After hours, contact General Neurology  I have seen the patient and reviewed the above note.  Her exam is much better today than what [email or web address] documented yesterday.  I wonder if she was having frequent seizures that were treated with her increased dose of Keppra.  She was started on dual antiplatelet therapy out of concern for stroke, however with multiple MRIs negative for stroke, I think cerebrovascular disease is less likely etiology for her acute presentation.  She does have significant intracranial disease, therefore would continue at least monotherapy with aspirin.  I will continue EEG overnight again tonight, to see if we can capture an episode of worsening, but is [place] as she continues to gradually improve I would continue current management.  Neurology will continue to follow.  Selina Cooley,  MD [facility] [phone]  If 7pm- 7am, please page neurology on call as listed in [facility]ION.

## 2022-03-21 NOTE — Plan of Care (Signed)
  Problem: Education: Goal: Knowledge of General Education information will improve Description: Including pain rating scale, medication(s)/side effects and non-pharmacologic comfort measures Outcome: Not Progressing   Problem: Health Behavior/Discharge Planning: Goal: Ability to manage health-related needs will improve Outcome: Not Progressing   

## 2022-03-21 NOTE — Progress Notes (Signed)
PROGRESS NOTE        PATIENT DETAILS Name: Krista Anderson Age: 76 y.o. Sex: female Date of Birth: 1945/08/10 Admit Date: 03/20/2022 Admitting Physician John Giovanni, MD JQG:BEEFEO, [facility]  Brief Summary: Patient is a 76 y.o.  female with history of DM/HTN-admitted to [facility] on 11/22 for aphasia-underwent stroke workup including MRI brain which was negative for CVA.  While at [facility] reoccurred, repeat stroke workup continue to be negative.  EEG showed slowing in the left hemisphere-subsequently transferred to [facility] for LTM EEG.    Significant events: 11/22>> aphasia/dysarthria/right arm numbness-admit to [facility] for stroke workup at [facility].  MRI brain negative for CVA. 11/23>> aphasia reoccurred-repeat MRI brain negative for CVA. 11/24>> transferred to [facility] for LTM EEG  Significant studies: 11/22>> A1c: 5.9 11/22>> CT head: No acute intracranial abnormality 11/23>>MRI brain: No acute intracranial process 11/23>> CT head: No acute intracranial abnormality 11/23>> CT angio head/neck: Negative for LVO 11/23>> CT venogram: Negative-dural sinuses/cerebral veins patent. 11/23>> MRI brain: No acute intracranial abnormality. 11/23>> echo: EF 55-60% 11/23>> Spot EEG: Slowing over left hemisphere-no seizures. 11/23>> LDL: 65  Significant microbiology data: None  Procedures: None  Consults: Neuro   Subjective: Lying comfortably in bed-somewhat confused but still appears to have some amount of expressive aphasia.  Objective: Vitals: Blood pressure (!) 167/71, pulse 64, temperature 98.1 F (36.7 C), temperature source Oral, resp. rate 14, weight 94.3 kg, SpO2 97 %.   Exam: Gen Exam:not in any distress HEENT:atraumatic, normocephalic Chest: B/L clear to auscultation anteriorly CVS:S1S2 regular Abdomen:soft non tender, non distended Extremities:no edema Neurology: Non focal Skin: no rash  Pertinent Labs/Radiology:    Latest Ref  Rng & Units 03/21/2022    3:31 AM 03/20/2022    6:27 AM 03/18/2022    3:41 PM  CBC  WBC 4.0 - 10.5 K/uL 8.2  7.8  6.7   Hemoglobin 12.0 - 15.0 g/dL 71.2  19.7  58.8   Hematocrit 36.0 - 46.0 % 39.4  38.6  42.6   Platelets 150 - 400 K/uL 106  107  121     Lab Results  Component Value Date   NA 138 03/20/2022   K 3.6 03/20/2022   CL 109 03/20/2022   CO2 24 03/20/2022      Assessment/Plan: Waxing/waning aphasia Acute CVA ruled out Concern for nonconvulsive seizures LTM EEG in progress Continues to have some amount of expressive aphasia-although seems to be somewhat confused this morning Continue Keppra Unclear if patient still requires aspirin/Plavix. Await further recommendations from neurology  HTN Continue lisinopril  HLD Continue statin  Thrombocytopenia Mild Unclear etiology Follow for now  Gout Stable-no flare On allopurinol  Obesity: Estimated body mass index is 35.68 kg/m as calculated from the following:   Height as of 03/18/22: 5\' 4"  (1.626 m).   Weight as of this encounter: 94.3 kg.   Code status:   Code Status: Full Code   DVT Prophylaxis: Place and maintain sequential compression device Start: 03/21/22 0123    Family Communication: None at bedside   Disposition Plan: Status is: Observation The patient will require care spanning > 2 midnights and should be moved to inpatient because: Fluctuating aphasia-concern for nonconvulsive seizures-LTM EEG in progress.   Planned Discharge Destination: CIR versus SNF   Diet: Diet Order  Diet heart healthy/carb modified Room service appropriate? Yes; Fluid consistency: Thin  Diet effective now                     Antimicrobial agents: Anti-infectives (From admission, onward)    None        MEDICATIONS: Scheduled Meds:  allopurinol  100 mg Oral Daily   aspirin  81 mg Oral Daily   clopidogrel  75 mg Oral Daily   lisinopril  20 mg Oral Daily   pravastatin  80 mg Oral  q1800   Continuous Infusions:  levETIRAcetam 1,000 mg (03/21/22 0243)   PRN Meds:.acetaminophen **OR** acetaminophen   I have personally reviewed following labs and imaging studies  LABORATORY DATA: CBC: Recent Labs  Lab 03/18/22 1541 03/20/22 0627 03/21/22 0331  WBC 6.7 7.8 8.2  NEUTROABS 3.0  --   --   HGB 13.9 12.6 13.1  HCT 42.6 38.6 39.4  MCV 89.9 89.8 91.0  PLT 121* 107* 106*    Basic Metabolic Panel: Recent Labs  Lab 03/18/22 1541 03/20/22 0627  NA 137 138  K 3.6 3.6  CL 104 109  CO2 25 24  GLUCOSE 107* 117*  BUN 17 12  CREATININE 1.06* 0.75  CALCIUM 9.3 8.6*    GFR: Estimated Creatinine Clearance: 67.6 mL/min (by C-G formula based on SCr of 0.75 mg/dL).  Liver Function Tests: Recent Labs  Lab 03/18/22 1541  AST 22  ALT 18  ALKPHOS 57  BILITOT 0.9  PROT 7.3  ALBUMIN 4.0   No results for input(s): "LIPASE", "AMYLASE" in the last 168 hours. No results for input(s): "AMMONIA" in the last 168 hours.  Coagulation Profile: Recent Labs  Lab 03/18/22 1541  INR 1.0    Cardiac Enzymes: No results for input(s): "CKTOTAL", "CKMB", "CKMBINDEX", "TROPONINI" in the last 168 hours.  BNP (last 3 results) No results for input(s): "PROBNP" in the last 8760 hours.  Lipid Profile: Recent Labs    03/19/22 0514  CHOL 127  HDL 49  LDLCALC 65  TRIG 67  CHOLHDL 2.6    Thyroid Function Tests: No results for input(s): "TSH", "T4TOTAL", "FREET4", "T3FREE", "THYROIDAB" in the last 72 hours.  Anemia Panel: No results for input(s): "VITAMINB12", "FOLATE", "FERRITIN", "TIBC", "IRON", "RETICCTPCT" in the last 72 hours.  Urine analysis: No results found for: "COLORURINE", "APPEARANCEUR", "LABSPEC", "PHURINE", "GLUCOSEU", "HGBUR", "BILIRUBINUR", "KETONESUR", "PROTEINUR", "UROBILINOGEN", "NITRITE", "LEUKOCYTESUR"  Sepsis Labs: Lactic Acid, Venous No results found for: "LATICACIDVEN"  MICROBIOLOGY: No results found for this or any previous visit (from  the past 240 hour(s)).  RADIOLOGY STUDIES/RESULTS: MR BRAIN W WO CONTRAST  Result Date: 03/19/2022 CLINICAL DATA:  Stroke suspected or possible seizure; aphasia EXAM: MRI HEAD WITHOUT AND WITH CONTRAST TECHNIQUE: Multiplanar, multiecho pulse sequences of the brain and surrounding structures were obtained without and with intravenous contrast. CONTRAST:  69mL GADAVIST GADOBUTROL 1 MMOL/ML IV SOLN COMPARISON:  03/19/2022 MRI head without contrast; correlation is also made with CT head 03/19/2022 FINDINGS: Brain: No abnormal parenchymal or meningeal enhancement. No restricted diffusion to suggest acute or subacute infarct. No acute hemorrhage, mass, mass effect, or midline shift. No hydrocephalus or extra-axial collection. 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 Advanced cerebral atrophy for age, with ex vacuo dilatation of the lateral and third ventricles. Mega cisterna magna. The hippocampi are symmetric in size and signal. No heterotopia or evidence of cortical dysgenesis. Vascular: Normal arterial flow voids. Normal arterial and venous enhancement.  Skull and upper cervical spine: Normal marrow signal. Sinuses/Orbits: Mucous retention cysts in the maxillary sinuses. Otherwise clear paranasal sinuses. No acute finding in the orbits. Other: Trace fluid in the bilateral mastoid air cells. IMPRESSION: No acute intracranial process. No evidence of acute or subacute infarct. No abnormal enhancement. Electronically Signed   By: Wiliam KeAlison  Vasan M.D.   On: 03/19/2022 19:33   EEG adult  Result Date: 03/19/2022 Jefferson FuelStack, Colleen M, MD     03/19/2022  6:43 PM Routine EEG Report Concha SeFaye H Griesinger is a 76 y.o. female with a history of aphasia who is undergoing an EEG to evaluate for seizures. Report: This EEG was acquired with electrodes placed according to the International 10-20 electrode system (including Fp1, Fp2, F3, F4, C3, C4, P3, P4, O1, O2,  T3, T4, T5, T6, A1, A2, Fz, Cz, Pz). The following electrodes were missing or displaced: none. The occipital dominant rhythm was 8.5 Hz. This activity is reactive to stimulation. Drowsiness was manifested by background fragmentation; deeper stages of sleep were identified by K complexes and sleep spindles. There was focal slowing over the left hemisphere in the theta range. There were no interictal epileptiform discharges. There were no electrographic seizures identified. Photic stimulation and hyperventilation were not performed. Impression and clinical correlation: This EEG was obtained while awake and asleep and is abnormal due to moderate slowing over the left hemisphere indicative of focal cerebral dysfunction in that region. Epileptiform abnormalities were not seen during this recording. Bing Neighborsolleen Stack, MD [facility] [phone] If 7pm- 7am, please page neurology on call as listed in AMION.   ECHOCARDIOGRAM COMPLETE  Result Date: 03/19/2022    ECHOCARDIOGRAM REPORT   Patient Name:   Harrington ChallengerFAYE H Alia Date of Exam: 03/19/2022 Medical Rec #:  [id number]   Height:       64.0 in Accession #:    [id number]  Weight:       200.0 lb Date of Birth:  04/11/1946   BSA:          1.956 m Patient Age:    76 years    BP:           151/78 mmHg Patient Gender: F           HR:           73 bpm. Exam Location:  [facility] Procedure: 2D Echo, Color Doppler and Cardiac Doppler Indications:     I63.9 Stroke  History:         Patient has no prior history of Echocardiogram examinations.                  Risk Factors:Hypertension.  Sonographer:     Humphrey RollsJoan Heiss Referring Phys:  [id number] Lucile ShuttersCHUKWU AGBATA Diagnosing Phys: Jodelle RedBridgette Christopher MD  Sonographer Comments: Suboptimal apical window and suboptimal subcostal window. IMPRESSIONS  1. Left ventricular ejection fraction, by estimation, is 55 to 60%. The left ventricle has normal function. The left ventricle has no regional wall motion abnormalities. There is mild concentric left  ventricular hypertrophy. Left ventricular diastolic parameters were normal.  2. Right ventricular systolic function is normal. The right ventricular size is normal.  3. The mitral valve is grossly normal. Trivial mitral valve regurgitation.  4. The aortic valve is tricuspid. There is mild calcification of the aortic valve. There is mild thickening of the aortic valve. Aortic valve regurgitation is not visualized. Aortic valve sclerosis/calcification is present, without any evidence of aortic stenosis.  5. The inferior vena cava is normal in  size with <50% respiratory variability, suggesting right atrial pressure of 8 mmHg. Comparison(s): No prior Echocardiogram. Conclusion(s)/Recommendation(s): Normal biventricular function without evidence of hemodynamically significant valvular heart disease. No intracardiac source of embolism detected on this transthoracic study. Consider a transesophageal echocardiogram to exclude cardiac source of embolism if clinically indicated. FINDINGS  Left Ventricle: Left ventricular ejection fraction, by estimation, is 55 to 60%. The left ventricle has normal function. The left ventricle has no regional wall motion abnormalities. The left ventricular internal cavity size was normal in size. There is  mild concentric left ventricular hypertrophy. Left ventricular diastolic parameters were normal. Right Ventricle: The right ventricular size is normal. No increase in right ventricular wall thickness. Right ventricular systolic function is normal. Left Atrium: Left atrial size was normal in size. Right Atrium: Right atrial size was normal in size. Pericardium: There is no evidence of pericardial effusion. Presence of epicardial fat layer. Mitral Valve: The mitral valve is grossly normal. Trivial mitral valve regurgitation. Tricuspid Valve: The tricuspid valve is grossly normal. Tricuspid valve regurgitation is trivial. Aortic Valve: The aortic valve is tricuspid. There is mild calcification  of the aortic valve. There is mild thickening of the aortic valve. Aortic valve regurgitation is not visualized. Aortic valve sclerosis/calcification is present, without any evidence of aortic stenosis. Aortic valve mean gradient measures 4.0 mmHg. Aortic valve peak gradient measures 8.2 mmHg. Aortic valve area, by VTI measures 1.61 cm. Pulmonic Valve: The pulmonic valve was not well visualized. Pulmonic valve regurgitation is trivial. No evidence of pulmonic stenosis. Aorta: The aortic root, ascending aorta, aortic arch and descending aorta are all structurally normal, with no evidence of dilitation or obstruction. Venous: The inferior vena cava is normal in size with less than 50% respiratory variability, suggesting right atrial pressure of 8 mmHg. IAS/Shunts: The interatrial septum was not well visualized.  LEFT VENTRICLE PLAX 2D LVIDd:         4.10 cm   Diastology LVIDs:         2.90 cm   LV e' medial:    7.94 cm/s LV PW:         1.20 cm   LV E/e' medial:  12.8 LV IVS:        1.33 cm   LV e' lateral:   7.18 cm/s LVOT diam:     1.70 cm   LV E/e' lateral: 14.2 LV SV:         46 LV SV Index:   24 LVOT Area:     2.27 cm  RIGHT VENTRICLE RV Basal diam:  3.10 cm RV S prime:     17.90 cm/s LEFT ATRIUM             Index        RIGHT ATRIUM           Index LA diam:        3.10 cm 1.58 cm/m   RA Area:     10.90 cm LA Vol (A2C):   21.4 ml 10.94 ml/m  RA Volume:   22.20 ml  11.35 ml/m LA Vol (A4C):   22.4 ml 11.45 ml/m LA Biplane Vol: 23.9 ml 12.22 ml/m  AORTIC VALVE                    PULMONIC VALVE AV Area (Vmax):    1.55 cm     PV Vmax:       1.03 m/s AV Area (Vmean):   1.56 cm  PV Vmean:      70.800 cm/s AV Area (VTI):     1.61 cm     PV VTI:        0.204 m AV Vmax:           143.00 cm/s  PV Peak grad:  4.2 mmHg AV Vmean:          96.200 cm/s  PV Mean grad:  2.0 mmHg AV VTI:            0.287 m AV Peak Grad:      8.2 mmHg AV Mean Grad:      4.0 mmHg LVOT Vmax:         97.40 cm/s LVOT Vmean:        66.000  cm/s LVOT VTI:          0.204 m LVOT/AV VTI ratio: 0.71  AORTA Ao Root diam: 3.10 cm MITRAL VALVE MV Area (PHT): 4.80 cm     SHUNTS MV Decel Time: 158 msec     Systemic VTI:  0.20 m MV E velocity: 102.00 cm/s  Systemic Diam: 1.70 cm MV A velocity: 142.00 cm/s MV E/A ratio:  0.72 Jodelle Red MD Electronically signed by Jodelle Red MD Signature Date/Time: 03/19/2022/6:35:54 PM    Final      LOS: 1 day   Jeoffrey Massed, MD  [facility]    To contact the attending provider between 7A-7P or the covering provider during after hours 7P-7A, please log into the web site [email or web address] and access using universal  password for that web site. If you do not have the password, please call the hospital operator.  03/21/2022, 8:58 AM

## 2022-03-21 NOTE — H&P (Signed)
History and Physical    SOLARIS KRAM AOZ:[id number] DOB: 08/17/1945 DOA: 03/20/2022  PCP: [facility]  Patient coming from:  [facility]  Chief Complaint: Seizure  HPI: Krista Anderson is a 76 y.o. female with medical history significant of hypertension, diet-controlled type 2 diabetes, obesity (BMI 34.33), gout admitted to [facility] on 11/22 for slurred speech, right arm numbness which had resolved and was thought to be due to TIA.  Brain MRI ruled out an acute stroke.  She was started on aspirin, Plavix,, and statin.  On 11/23, code stroke was called for aphasia/dysarthria.  With unknown timing she was not a candidate for TNKase.  CTA head and neck was negative for LVO.  Repeat brain MRI was negative for stroke.  EEG showed focal slowing over the left hemisphere for which she was started on Keppra.  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  Dose of Keppra was increased to 1000 mg every 12 hours and patient transferred to [facility] for continuous EEG monitoring.  Patient is confused and kept on repeating her first name.  She is able to tell me that she is from [place] and that the month is November.  Not able to answer any additional questions.  Review of Systems:  Review of Systems  Reason unable to perform ROS: AMS.    Past Medical History:  Diagnosis Date   Borderline diabetes    Hypertension     Past Surgical History:  Procedure Laterality Date   ABDOMINAL HYSTERECTOMY     APPENDECTOMY     BACK SURGERY     CARPAL TUNNEL RELEASE Bilateral    CHOLECYSTECTOMY     FRACTURE SURGERY     both wrists     reports that she has never smoked. She has never used smokeless tobacco. She reports that she does not drink alcohol. No history on file for drug use.  Allergies  Allergen Reactions   Amitriptyline Other (See Comments)    Makes me crazy    Elemental Sulfur Nausea Only    Sick on stomach      Family History  Problem Relation Age of Onset   Breast cancer Neg Hx     Prior to Admission medications   Medication Sig Start Date End Date Taking? Authorizing Provider  acetaminophen (TYLENOL) 500 MG tablet Take 500 mg by mouth every 6 (six) hours as needed for mild pain.     [provider]  allopurinol (ZYLOPRIM) 100 MG tablet Take 100 mg by mouth daily. 02/26/22   [provider]  aspirin 81 MG chewable tablet Chew 81 mg by mouth daily. 01/16/09   [provider]  clopidogrel (PLAVIX) 75 MG tablet Take 1 tablet (75 mg total) by mouth daily. 03/21/22   Delfino Lovett, MD  Colchicine 0.6 MG CAPS Take 0.6 mg by mouth daily as needed (Gout). 02/25/22   [provider]  cyanocobalamin (VITAMIN B12) 1000 MCG tablet Take 1,000 mcg by mouth daily. 07/30/21   [provider]  fluticasone (FLONASE) 50 MCG/ACT nasal spray Place 1 spray into both nostrils 2 (two) times daily. 09/09/21   [provider]  gabapentin (NEURONTIN) 300 MG capsule Take 300 mg by mouth 3 (three) times daily.    [provider]  levETIRAcetam (KEPPRA) 1000 MG/100ML SOLN Inject 100 mLs (1,000 mg total) into the vein every 12 (twelve) hours. 03/20/22   Delfino Lovett, MD  lisinopril (PRINIVIL,ZESTRIL) 20 MG tablet  Take 20 mg by mouth daily.    [provider]  meloxicam (MOBIC) 15 MG tablet Take 15 mg by mouth daily. 02/25/22   [provider]  polyethylene glycol powder (GLYCOLAX/MIRALAX) powder Take 17 g by mouth daily as needed for constipation. 12/16/15   [provider]  pravastatin (PRAVACHOL) 80 MG tablet Take 80 mg by mouth at bedtime.    [provider]  VITAMIN D-1000 MAX ST 25 MCG (1000 UT) tablet Take 1,000 Units by mouth daily. 12/05/21   [provider]  VOLTAREN 1 % GEL Apply 1 application topically daily as needed for pain. 01/15/16   [provider]    Physical Exam: There were no vitals filed for this  visit.  Physical Exam Vitals reviewed.  Constitutional:      General: She is not in acute distress. HENT:     Head: Normocephalic and atraumatic.  Eyes:     Extraocular Movements: Extraocular movements intact.  Cardiovascular:     Rate and Rhythm: Normal rate and regular rhythm.     Pulses: Normal pulses.  Pulmonary:     Effort: Pulmonary effort is normal. No respiratory distress.     Breath sounds: Normal breath sounds.  Abdominal:     General: Bowel sounds are normal. There is no distension.     Palpations: Abdomen is soft.     Tenderness: There is no abdominal tenderness.  Musculoskeletal:     Cervical back: Normal range of motion.     Right lower leg: No edema.     Left lower leg: No edema.  Skin:    General: Skin is warm and dry.  Neurological:     General: No focal deficit present.     Mental Status: She is alert.     Cranial Nerves: No cranial nerve deficit.     Sensory: No sensory deficit.     Motor: No weakness.     Labs on Admission: I have personally reviewed following labs and imaging studies  CBC: Recent Labs  Lab 03/18/22 1541 03/20/22 0627  WBC 6.7 7.8  NEUTROABS 3.0  --   HGB 13.9 12.6  HCT 42.6 38.6  MCV 89.9 89.8  PLT 121* [id number]*   Basic Metabolic Panel: Recent Labs  Lab 03/18/22 1541 03/20/22 0627  NA 137 138  K 3.6 3.6  CL 104 109  CO2 25 24  GLUCOSE 107* 117*  BUN 17 12  CREATININE 1.06* 0.75  CALCIUM 9.3 8.6*   GFR: Estimated Creatinine Clearance: 66.3 mL/min (by C-G formula based on SCr of 0.75 mg/dL). Liver Function Tests: Recent Labs  Lab 03/18/22 1541  AST 22  ALT 18  ALKPHOS 57  BILITOT 0.9  PROT 7.3  ALBUMIN 4.0   No results for input(s): "LIPASE", "AMYLASE" in the last 168 hours. No results for input(s): "AMMONIA" in the last 168 hours. Coagulation Profile: Recent Labs  Lab 03/18/22 1541  INR 1.0   Cardiac Enzymes: No results for input(s): "CKTOTAL", "CKMB", "CKMBINDEX", "TROPONINI" in the last 168  hours. BNP (last 3 results) No results for input(s): "PROBNP" in the last 8760 hours. HbA1C: Recent Labs    03/18/22 1541  HGBA1C 5.9*   CBG: No results for input(s): "GLUCAP" in the last 168 hours. Lipid Profile: Recent Labs    03/19/22 0514  CHOL 127  HDL 49  LDLCALC 65  TRIG 67  CHOLHDL 2.6   Thyroid Function Tests: No results for input(s): "TSH", "T4TOTAL", "FREET4", "T3FREE", "THYROIDAB" in the  last 72 hours. Anemia Panel: No results for input(s): "VITAMINB12", "FOLATE", "FERRITIN", "TIBC", "IRON", "RETICCTPCT" in the last 72 hours. Urine analysis: No results found for: "COLORURINE", "APPEARANCEUR", "LABSPEC", "PHURINE", "GLUCOSEU", "HGBUR", "BILIRUBINUR", "KETONESUR", "PROTEINUR", "UROBILINOGEN", "NITRITE", "LEUKOCYTESUR"  Radiological Exams on Admission: MR BRAIN W WO CONTRAST  Result Date: 03/19/2022 CLINICAL DATA:  Stroke suspected or possible seizure; aphasia EXAM: MRI HEAD WITHOUT AND WITH CONTRAST TECHNIQUE: Multiplanar, multiecho pulse sequences of the brain and surrounding structures were obtained without and with intravenous contrast. CONTRAST:  31mL GADAVIST GADOBUTROL 1 MMOL/ML IV SOLN COMPARISON:  03/19/2022 MRI head without contrast; correlation is also made with CT head 03/19/2022 FINDINGS: Brain: No abnormal parenchymal or meningeal enhancement. No restricted diffusion to suggest acute or subacute infarct. No acute hemorrhage, mass, mass effect, or midline shift. No hydrocephalus or extra-axial collection. 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 Advanced cerebral atrophy for age, with ex vacuo dilatation of the lateral and third ventricles. Mega cisterna magna. The hippocampi are symmetric in size and signal. No heterotopia or evidence of cortical dysgenesis. Vascular: Normal arterial flow voids. Normal arterial and venous enhancement. Skull and upper cervical spine: Normal marrow  signal. Sinuses/Orbits: Mucous retention cysts in the maxillary sinuses. Otherwise clear paranasal sinuses. No acute finding in the orbits. Other: Trace fluid in the bilateral mastoid air cells. IMPRESSION: No acute intracranial process. No evidence of acute or subacute infarct. No abnormal enhancement. Electronically Signed   By: Merilyn Baba M.D.   On: 03/19/2022 19:33   EEG adult  Result Date: 03/19/2022 Derek Jack, MD     03/19/2022  6:43 PM Routine EEG Report DEMETA LIGHTBODY is a 76 y.o. female with a history of aphasia who is undergoing an EEG to evaluate for seizures. Report: This EEG was acquired with electrodes placed according to the International 10-20 electrode system (including Fp1, Fp2, F3, F4, C3, C4, P3, P4, O1, O2, T3, T4, T5, T6, A1, A2, Fz, Cz, Pz). The following electrodes were missing or displaced: none. The occipital dominant rhythm was 8.5 Hz. This activity is reactive to stimulation. Drowsiness was manifested by background fragmentation; deeper stages of sleep were identified by K complexes and sleep spindles. There was focal slowing over the left hemisphere in the theta range. There were no interictal epileptiform discharges. There were no electrographic seizures identified. Photic stimulation and hyperventilation were not performed. Impression and clinical correlation: This EEG was obtained while awake and asleep and is abnormal due to moderate slowing over the left hemisphere indicative of focal cerebral dysfunction in that region. Epileptiform abnormalities were not seen during this recording. Su Monks, MD [facility] [phone] If 7pm- 7am, please page neurology on call as listed in [place].   ECHOCARDIOGRAM COMPLETE  Result Date: 03/19/2022    ECHOCARDIOGRAM REPORT   Patient Name:   EMPERATRIZ MEDWID Olafson Date of Exam: 03/19/2022 Medical Rec #:  [id number]   Height:       64.0 in Accession #:    [id number]  Weight:       200.0 lb Date of Birth:  1945-12-25   BSA:           1.956 m Patient Age:    30 years    BP:           151/78 mmHg Patient Gender: F           HR:           73 bpm. Exam  Location:  [facility] Procedure: 2D Echo, Color Doppler and Cardiac Doppler Indications:     I63.9 Stroke  History:         Patient has no prior history of Echocardiogram examinations.                  Risk Factors:Hypertension.  Sonographer:     Charmayne Sheer Referring Phys:  [place] Diagnosing Phys: Buford Dresser MD  Sonographer Comments: Suboptimal apical window and suboptimal subcostal window. IMPRESSIONS  1. Left ventricular ejection fraction, by estimation, is 55 to 60%. The left ventricle has normal function. The left ventricle has no regional wall motion abnormalities. There is mild concentric left ventricular hypertrophy. Left ventricular diastolic parameters were normal.  2. Right ventricular systolic function is normal. The right ventricular size is normal.  3. The mitral valve is grossly normal. Trivial mitral valve regurgitation.  4. The aortic valve is tricuspid. There is mild calcification of the aortic valve. There is mild thickening of the aortic valve. Aortic valve regurgitation is not visualized. Aortic valve sclerosis/calcification is present, without any evidence of aortic stenosis.  5. The inferior vena cava is normal in size with <50% respiratory variability, suggesting right atrial pressure of 8 mmHg. Comparison(s): No prior Echocardiogram. Conclusion(s)/Recommendation(s): Normal biventricular function without evidence of hemodynamically significant valvular heart disease. No intracardiac source of embolism detected on this transthoracic study. Consider a transesophageal echocardiogram to exclude cardiac source of embolism if clinically indicated. FINDINGS  Left Ventricle: Left ventricular ejection fraction, by estimation, is 55 to 60%. The left ventricle has normal function. The left ventricle has no regional wall motion abnormalities. The left ventricular  internal cavity size was normal in size. There is  mild concentric left ventricular hypertrophy. Left ventricular diastolic parameters were normal. Right Ventricle: The right ventricular size is normal. No increase in right ventricular wall thickness. Right ventricular systolic function is normal. Left Atrium: Left atrial size was normal in size. Right Atrium: Right atrial size was normal in size. Pericardium: There is no evidence of pericardial effusion. Presence of epicardial fat layer. Mitral Valve: The mitral valve is grossly normal. Trivial mitral valve regurgitation. Tricuspid Valve: The tricuspid valve is grossly normal. Tricuspid valve regurgitation is trivial. Aortic Valve: The aortic valve is tricuspid. There is mild calcification of the aortic valve. There is mild thickening of the aortic valve. Aortic valve regurgitation is not visualized. Aortic valve sclerosis/calcification is present, without any evidence of aortic stenosis. Aortic valve mean gradient measures 4.0 mmHg. Aortic valve peak gradient measures 8.2 mmHg. Aortic valve area, by VTI measures 1.61 cm. Pulmonic Valve: The pulmonic valve was not well visualized. Pulmonic valve regurgitation is trivial. No evidence of pulmonic stenosis. Aorta: The aortic root, ascending aorta, aortic arch and descending aorta are all structurally normal, with no evidence of dilitation or obstruction. Venous: The inferior vena cava is normal in size with less than 50% respiratory variability, suggesting right atrial pressure of 8 mmHg. IAS/Shunts: The interatrial septum was not well visualized.  LEFT VENTRICLE PLAX 2D LVIDd:         4.10 cm   Diastology LVIDs:         2.90 cm   LV e' medial:    7.94 cm/s LV PW:         1.20 cm   LV E/e' medial:  12.8 LV IVS:        1.33 cm   LV e' lateral:   7.18 cm/s LVOT diam:  1.70 cm   LV E/e' lateral: 14.2 LV SV:         46 LV SV Index:   24 LVOT Area:     2.27 cm  RIGHT VENTRICLE RV Basal diam:  3.10 cm RV S prime:      17.90 cm/s LEFT ATRIUM             Index        RIGHT ATRIUM           Index LA diam:        3.10 cm 1.58 cm/m   RA Area:     10.90 cm LA Vol (A2C):   21.4 ml 10.94 ml/m  RA Volume:   22.20 ml  11.35 ml/m LA Vol (A4C):   22.4 ml 11.45 ml/m LA Biplane Vol: 23.9 ml 12.22 ml/m  AORTIC VALVE                    PULMONIC VALVE AV Area (Vmax):    1.55 cm     PV Vmax:       1.03 m/s AV Area (Vmean):   1.56 cm     PV Vmean:      70.800 cm/s AV Area (VTI):     1.61 cm     PV VTI:        0.204 m AV Vmax:           143.00 cm/s  PV Peak grad:  4.2 mmHg AV Vmean:          96.200 cm/s  PV Mean grad:  2.0 mmHg AV VTI:            0.287 m AV Peak Grad:      8.2 mmHg AV Mean Grad:      4.0 mmHg LVOT Vmax:         97.40 cm/s LVOT Vmean:        66.000 cm/s LVOT VTI:          0.204 m LVOT/AV VTI ratio: 0.71  AORTA Ao Root diam: 3.10 cm MITRAL VALVE MV Area (PHT): 4.80 cm     SHUNTS MV Decel Time: 158 msec     Systemic VTI:  0.20 m MV E velocity: 102.00 cm/s  Systemic Diam: 1.70 cm MV A velocity: 142.00 cm/s MV E/A ratio:  0.72 Buford Dresser MD Electronically signed by Buford Dresser MD Signature Date/Time: 03/19/2022/6:35:54 PM    Final    CT ANGIO HEAD NECK W WO CM W PERF (CODE STROKE)  Result Date: 03/19/2022 CLINICAL DATA:  76 year old female code stroke presentation this morning. EXAM: CT ANGIOGRAPHY HEAD AND NECK CT PERFUSION BRAIN CT VENOGRAM HEAD TECHNIQUE: Multidetector CT imaging of the head and neck was performed using the standard protocol during bolus administration of intravenous contrast. Multiplanar CT image reconstructions and MIPs were obtained to evaluate the vascular anatomy. Carotid stenosis measurements (when applicable) are obtained utilizing NASCET criteria, using the distal internal carotid diameter as the denominator. Multiphase CT imaging of the brain was performed following IV bolus contrast injection. Subsequent parametric perfusion maps were calculated using RAPID software.  Venographic phase images of the brain were obtained following the administration of intravenous contrast. Multiplanar reformats and maximum intensity projections were generated. RADIATION DOSE REDUCTION: This exam was performed according to the departmental dose-optimization program which includes automated exposure control, adjustment of the mA and/or kV according to patient size and/or use of iterative reconstruction technique. CONTRAST:  147mL OMNIPAQUE IOHEXOL 350 MG/ML  SOLN COMPARISON:  Plain head CT 0822 hours. FINDINGS: CT Brain Perfusion Findings: ASPECTS: 10 CBF (<30%) Volume: 82mL Perfusion (Tmax>6.0s) volume: 48mL No CBF, CBV or T-max abnormal parenchyma detected. Mismatch Volume: Not applicable Infarction Location:Not applicable CTA NECK Skeleton: Mild for age cervical spine degeneration. No acute osseous abnormality identified. Upper chest: Negative. Other neck: 13 mm right thyroid nodule , subcentimeter nodule on the left. Not clinically significant and no follow-up imaging recommended. (Ref: J Am Coll Radiol. 2015 Feb;12(2): 143-50).Other neck soft tissue spaces appear within normal limits. Aortic arch: Calcified aortic atherosclerosis. 3 vessel arch configuration. Right carotid system: Mild brachiocephalic artery soft and calcified plaque without stenosis. Tortuous proximal right CCA with no plaque or stenosis. Soft and calcified plaque at the right ICA origin and bulb with up to 50 % stenosis with respect to the distal vessel. Left carotid system: Little plaque at the left CCA origin but widespread soft and calcified plaque along the medial vessel before the bifurcation. No significant left CCA stenosis. Confluent calcified plaque at the left ICA origin and bulb with up to 50 % stenosis with respect to the distal vessel. Vertebral arteries: Proximal right subclavian artery plaque including involvement of the right vertebral artery origin with mild stenosis (series 9, image 201). Mildly dominant right  vertebral otherwise patent to the skull base with no plaque or stenosis. Proximal left subclavian artery plaque without stenosis. Normal left vertebral artery origin. Non dominant left vertebral artery is patent to the skull base with no plaque or stenosis. CTA HEAD Posterior circulation: Dominant right V4 segment with bulky calcified plaque and moderate to severe stenosis (series 9, image 182. But the vessel remains patent to the vertebrobasilar junction. Non dominant left vertebral V4 segment demonstrates mild plaque, mild stenosis, and patent vertebrobasilar junction. Left PICA origin is patent. Right AICA is patent and may be dominant. Patent basilar artery without stenosis. Patent SCA and PCA origins. Posterior communicating arteries are diminutive or absent. Right PCA branches are within normal limits. Left PCA P1 segment is tortuous and irregular with up to moderate stenosis on series 11, image 49. Left PCA branches are otherwise within normal limits. Anterior circulation: Both ICA siphons are patent. Mild to moderate left siphon calcified plaque most pronounced in the supraclinoid segment. Mild to moderate supraclinoid stenosis on the left. Similar right siphon calcified plaque with mild to moderate supraclinoid stenosis. A small vessel infundibulum at the right ICA terminus is suspected on series 8, images 112 and 113. A small 3 mm saccular aneurysm is less likely. Patent carotid termini, MCA and ACA origins. Anterior communicating artery and bilateral ACA branches are within normal limits. Left MCA M1 segment and bifurcation are patent with only mild irregularity and stenosis. Left MCA branches are within normal limits. Right MCA M1 segment and bifurcation are patent without stenosis, mild tortuosity. Right MCA branches are within normal limits. CTV HEAD Venous sinuses: Dedicated CT venogram images at 0850 hours. Dural venous sinuses, vein of Galen, internal cerebral veins, cavernous sinuses are enhancing  and patent. IJ bulbs are enhancing and patent. Anatomic variants: Mildly dominant right vertebral artery. Review of the MIP images confirms the above findings IMPRESSION: 1. Negative for large vessel occlusion. Negative CT Perfusion. Negative CT Venogram. 2. 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 - moderate stenosis Left PCA P1 segment  stenosis. - bilateral carotid bifurcation plaque with up to 50% proximal ICA stenosis. 3. Positive also for a small 3 mm outpouching at the distal Right ICA. But favor incidental vessel infundibulum rather than unruptured saccular aneurysm. Electronically Signed   By: Genevie Ann M.D.   On: 03/19/2022 09:07   CT VENOGRAM HEAD  Result Date: 03/19/2022 CLINICAL DATA:  76 year old female code stroke presentation this morning. EXAM: CT ANGIOGRAPHY HEAD AND NECK CT PERFUSION BRAIN CT VENOGRAM HEAD TECHNIQUE: Multidetector CT imaging of the head and neck was performed using the standard protocol during bolus administration of intravenous contrast. Multiplanar CT image reconstructions and MIPs were obtained to evaluate the vascular anatomy. Carotid stenosis measurements (when applicable) are obtained utilizing NASCET criteria, using the distal internal carotid diameter as the denominator. Multiphase CT imaging of the brain was performed following IV bolus contrast injection. Subsequent parametric perfusion maps were calculated using RAPID software. Venographic phase images of the brain were obtained following the administration of intravenous contrast. Multiplanar reformats and maximum intensity projections were generated. RADIATION DOSE REDUCTION: This exam was performed according to the departmental dose-optimization program which includes automated exposure control, adjustment of the mA  and/or kV according to patient size and/or use of iterative reconstruction technique. CONTRAST:  171mL OMNIPAQUE IOHEXOL 350 MG/ML SOLN COMPARISON:  Plain head CT 0822 hours. FINDINGS: CT Brain Perfusion Findings: ASPECTS: 10 CBF (<30%) Volume: 48mL Perfusion (Tmax>6.0s) volume: 15mL No CBF, CBV or T-max abnormal parenchyma detected. Mismatch Volume: Not applicable Infarction Location:Not applicable CTA NECK Skeleton: Mild for age cervical spine degeneration. No acute osseous abnormality identified. Upper chest: Negative. Other neck: 13 mm right thyroid nodule , subcentimeter nodule on the left. Not clinically significant and no follow-up imaging recommended. (Ref: J Am Coll Radiol. 2015 Feb;12(2): 143-50).Other neck soft tissue spaces appear within normal limits. Aortic arch: Calcified aortic atherosclerosis. 3 vessel arch configuration. Right carotid system: Mild brachiocephalic artery soft and calcified plaque without stenosis. Tortuous proximal right CCA with no plaque or stenosis. Soft and calcified plaque at the right ICA origin and bulb with up to 50 % stenosis with respect to the distal vessel. Left carotid system: Little plaque at the left CCA origin but widespread soft and calcified plaque along the medial vessel before the bifurcation. No significant left CCA stenosis. Confluent calcified plaque at the left ICA origin and bulb with up to 50 % stenosis with respect to the distal vessel. Vertebral arteries: Proximal right subclavian artery plaque including involvement of the right vertebral artery origin with mild stenosis (series 9, image 201). Mildly dominant right vertebral otherwise patent to the skull base with no plaque or stenosis. Proximal left subclavian artery plaque without stenosis. Normal left vertebral artery origin. Non dominant left vertebral artery is patent to the skull base with no plaque or stenosis. CTA HEAD Posterior circulation: Dominant right V4 segment with bulky calcified plaque and  moderate to severe stenosis (series 9, image 182. But the vessel remains patent to the vertebrobasilar junction. Non dominant left vertebral V4 segment demonstrates mild plaque, mild stenosis, and patent vertebrobasilar junction. Left PICA origin is patent. Right AICA is patent and may be dominant. Patent basilar artery without stenosis. Patent SCA and PCA origins. Posterior communicating arteries are diminutive or absent. Right PCA branches are within normal limits. Left PCA P1 segment is tortuous and irregular with up to moderate stenosis on series 11, image 49. Left PCA branches are otherwise within normal limits. Anterior circulation: Both ICA siphons are patent. Mild to moderate  left siphon calcified plaque most pronounced in the supraclinoid segment. Mild to moderate supraclinoid stenosis on the left. Similar right siphon calcified plaque with mild to moderate supraclinoid stenosis. A small vessel infundibulum at the right ICA terminus is suspected on series 8, images 112 and 113. A small 3 mm saccular aneurysm is less likely. Patent carotid termini, MCA and ACA origins. Anterior communicating artery and bilateral ACA branches are within normal limits. Left MCA M1 segment and bifurcation are patent with only mild irregularity and stenosis. Left MCA branches are within normal limits. Right MCA M1 segment and bifurcation are patent without stenosis, mild tortuosity. Right MCA branches are within normal limits. CTV HEAD Venous sinuses: Dedicated CT venogram images at 0850 hours. Dural venous sinuses, vein of Galen, internal cerebral veins, cavernous sinuses are enhancing and patent. IJ bulbs are enhancing and patent. Anatomic variants: Mildly dominant right vertebral artery. Review of the MIP images confirms the above findings IMPRESSION: 1. Negative for large vessel occlusion. Negative CT Perfusion. Negative CT Venogram. 2. 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 - moderate stenosis Left PCA P1 segment stenosis. - bilateral carotid bifurcation plaque with up to 50% proximal ICA stenosis. 3. Positive also for a small 3 mm outpouching at the distal Right ICA. But favor incidental vessel infundibulum rather than unruptured saccular aneurysm. Electronically Signed   By: Odessa Fleming M.D.   On: 03/19/2022 09:07   CT HEAD CODE STROKE WO CONTRAST  Result Date: 03/19/2022 CLINICAL DATA:  Code stroke. Neuro deficit, acute, stroke suspected. Headache. Patient admitted yesterday. Severe aphasia and dysarthria this morning. EXAM: CT HEAD WITHOUT CONTRAST TECHNIQUE: Contiguous axial images were obtained from the base of the skull through the vertex without intravenous contrast. RADIATION DOSE REDUCTION: This exam was performed according to the departmental dose-optimization program which includes automated exposure control, adjustment of the mA and/or kV according to patient size and/or use of iterative reconstruction technique. COMPARISON:  CT head without contrast 03/18/2022. MR head without contrast 03/19/2022. FINDINGS: Brain: No acute infarct, hemorrhage, or mass lesion is present. Stable atrophy and white matter disease. Ventricular enlargement is stable. 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 Remote encephalomalacia of the right temporal tip noted. No significant extraaxial fluid collection is present. The brainstem and cerebellum are within normal limits. Vascular: Atherosclerotic calcifications are again noted within the cavernous internal carotid arteries bilaterally and at the dural margin of both vertebral arteries. No hyperdense vessel is present. Skull: Calvarium is intact. No focal lytic or blastic lesions are present. No significant extracranial soft tissue lesion is present.  Sinuses/Orbits: The paranasal sinuses and mastoid air cells are clear. The globes and orbits are within normal limits. ASPECTS [facility] Stroke Program Early CT Score) - Ganglionic level infarction (caudate, lentiform nuclei, internal capsule, insula, M1-M3 cortex): 7/7 - Supraganglionic infarction (M4-M6 cortex): 3/3 Total score (0-10 with 10 being normal): 10/10 IMPRESSION: 1. No acute intracranial abnormality or significant interval change. 2. Stable atrophy and white matter disease. This likely reflects the sequela of chronic microvascular ischemia. 3. 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 4. Remote encephalomalacia of the right temporal tip. 5. Aspects is 10/10. The above was relayed via text pager to Dr. Selina Cooley On 03/19/2022 at 08:27 . Electronically Signed   By: Cristal Deer  Mattern M.D.   On: 03/19/2022 08:28   MR BRAIN WO CONTRAST  Result Date: 03/19/2022 CLINICAL DATA:  Some onset dizziness, slurred speech, and right arm numbness EXAM: MRI HEAD WITHOUT CONTRAST TECHNIQUE: Multiplanar, multiecho pulse sequences of the brain and surrounding structures were obtained without intravenous contrast. COMPARISON:  03/04/2020 MRI head, correlation is also made with 03/18/2022 CT head FINDINGS: Brain: No restricted diffusion to suggest acute or subacute infarct. No acute hemorrhage, mass, mass effect, midline shift. No hydrocephalus or extra-axial collection. 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 Redemonstrated encephalomalacia in the anterior right temporal lobe. Advanced cerebral atrophy for age, with ex vacuo dilatation of lateral and third ventricles. Redemonstrated mega cisterna magna. Vascular: Normal arterial flow voids. Skull and upper cervical spine: Normal marrow signal. Sinuses/Orbits: Mucous retention cysts in the maxillary sinuses. The orbits are unremarkable. Other: Trace fluid in the bilateral mastoid air cells. IMPRESSION: No acute intracranial  process. No evidence of acute or subacute infarct. Electronically Signed   By: Merilyn Baba M.D.   On: 03/19/2022 01:27    Assessment and Plan  Nonconvulsive seizures -Patient sent here from [facility] for fluctuating aphasia felt to be secondary to nonconvulsive seizures.  Sent here for continuous EEG monitoring. -Neurology at [facility] notified and set up overnight EEG with video. -Seizure precautions -Continue Keppra 1000 mg every 12 hours  Recent TIA -Continue aspirin, Plavix, and statin.  Hypertension -Continue lisinopril  Diet controlled type 2 diabetes A1c 5.9 on 03/18/2022.  Gout -Continue allopurinol  Mild thrombocytopenia Platelet count 107k on labs done yesterday.  No signs of active bleeding. -Continue to monitor  DVT prophylaxis: SCDs Code Status: Full Code Family Communication: No family available at this time. Consults called: Neurology Level of care: Telemetry bed Admission status: It is my clinical opinion that referral for OBSERVATION is reasonable and necessary in this patient based on the above information provided. The aforementioned taken together are felt to place the patient at high risk for further clinical deterioration. However, it is anticipated that the patient may be medically stable for discharge from the hospital within 24 to 48 hours.   Shela Leff MD [facility]  If 7PM-7AM, please contact night-coverage [email or web address]  03/21/2022, 12:57 AM

## 2022-03-21 NOTE — Progress Notes (Signed)
LTM EEG hooked up and running - no initial skin breakdown - push button tested - [facility] monitoring.  

## 2022-03-21 NOTE — Progress Notes (Signed)
OT Cancellation Note  Patient Details Name: ANEDRA PENAFIEL MRN: [id number] DOB: 02/18/1946   Cancelled Treatment:    Reason Eval/Treat Not Completed: Patient at procedure or test/ unavailable. On continuous EEG at this time. OT to check back as time allows.   Kallie Edward OTR/L Supplemental OT, Department of rehab services [phone]  Sheriann Newmann R H. 03/21/2022, 9:58 AM

## 2022-03-21 NOTE — Care Management Obs Status (Signed)
MEDICARE OBSERVATION STATUS NOTIFICATION   Patient Details  Name: STEELE LEDONNE MRN: [id number] Date of Birth: 10-Nov-1945   Medicare Observation Status Notification Given:  Yes    Joannah Gitlin G., RN 03/21/2022, 10:38 AM

## 2022-03-21 NOTE — Procedures (Addendum)
Patient Name: Krista Anderson  MRN: [id number]  Epilepsy Attending: Charlsie Quest  Referring Physician/Provider: Erick Blinks, MD  Duration: 03/21/2022 0053 to 03/22/2022 0053  Patient history: 76 y.o. female with a history of aphasia who is undergoing an EEG to evaluate for seizure   Level of alertness: Awake, asleep  AEDs during EEG study: LEV  Technical aspects: This EEG study was done with scalp electrodes positioned according to the 10-20 International system of electrode placement. Electrical activity was reviewed with band pass filter of 1-70Hz , sensitivity of 7 uV/mm, display speed of 20mm/sec with a 60Hz  notched filter applied as appropriate. EEG data were recorded continuously and digitally stored.  Video monitoring was available and reviewed as appropriate.  Description: The posterior dominant rhythm consists of 8 Hz activity of moderate voltage (25-35 uV) seen predominantly in posterior head regions, asymmetric ( left<right) and reactive to eye opening and eye closing. Sleep was characterized by vertex waves, sleep spindles (12 to 14 Hz), maximal frontocentral region. EEG showed continuous 3 to 6 Hz theta-delta slowing in left hemisphere. Hyperventilation and photic stimulation were not performed.     ABNORMALITY - Continuous slow, left hemisphere - Background asymmetry, left<right  IMPRESSION: This study is suggestive of cortical dysfunction arising from left hemisphere likely secondary to underlying structural abnormality. No seizures or epileptiform discharges were seen throughout the recording.  Nuri Larmer Annabelle Harman

## 2022-03-22 MED ORDER — LEVETIRACETAM 1000 MG PO TABS: 1000.0000 mg | ORAL_TABLET | Freq: Two times a day (BID) | ORAL | 1 refills | Status: DC

## 2022-03-22 NOTE — Plan of Care (Signed)
  Problem: Education: Goal: Knowledge of General Education information will improve Description: Including pain rating scale, medication(s)/side effects and non-pharmacologic comfort measures Outcome: Not Progressing   Problem: Health Behavior/Discharge Planning: Goal: Ability to manage health-related needs will improve Outcome: Not Progressing   

## 2022-03-22 NOTE — Progress Notes (Signed)
LTM EEG discontinued - no skin breakdown at unhook.   

## 2022-03-22 NOTE — Discharge Summary (Addendum)
PATIENT DETAILS Name: Krista Anderson Age: 76 y.o. Sex: female Date of Birth: [birth year] MRN: [id number]. Admitting Physician: Jonetta Osgood, MD [id number], [place] Date: 03/20/2022 Discharge date: 03/22/2022  Recommendations for Outpatient Follow-up:  Follow up with PCP in 1-2 weeks Please obtain CMP/CBC in one week Please ensure follow-up with neurology  Admitted From:  Home  Disposition: Home   Discharge Condition: good  CODE STATUS:   Code Status: Full Code   Diet recommendation:  Diet Order             Diet - low sodium heart healthy           Diet heart healthy/carb modified Room service appropriate? Yes; Fluid consistency: Thin  Diet effective now                    Brief Summary: Patient is a 76 y.o.  female with history of DM/HTN-admitted to [facility] on 11/22 for aphasia-underwent stroke workup including MRI brain which was negative for CVA.  While at [facility] reoccurred, repeat stroke workup continue to be negative.  EEG showed slowing in the left hemisphere-subsequently transferred to [facility] for LTM EEG.     Significant events: 11/22>> aphasia/dysarthria/right arm numbness-admit to [facility] for stroke workup at [facility].  MRI brain negative for CVA. 11/23>> aphasia reoccurred-repeat MRI brain negative for CVA. 11/24>> transferred to [facility] for LTM EEG   Significant studies: 11/22>> A1c: 5.9 11/22>> CT head: No acute intracranial abnormality 11/23>>MRI brain: No acute intracranial process 11/23>> CT head: No acute intracranial abnormality 11/23>> CT angio head/neck: Negative for LVO 11/23>> CT venogram: Negative-dural sinuses/cerebral veins patent. 11/23>> MRI brain: No acute intracranial abnormality. 11/23>> echo: EF 55-60% 11/23>> Spot EEG: Slowing over left hemisphere-no seizures. 11/23>> LDL: 65 11/26-11/26>> LTM EEG: No seizures, cortical dysfunction arising from left hemisphere.   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   Discussed with neurologist-Dr. Cheryln Manly to discharge on current dosing of Keppra  Patient follows with neurology at [facility]-I have asked the family to call tomorrow-when the office opens up for a follow-up appointment.  Patient aware of driving restrictions   HTN Continue lisinopril   HLD Continue statin   Thrombocytopenia Mild Unclear etiology Follow for now   Gout Stable-no flare On allopurinol   Obesity: Estimated body mass index is 35.68 kg/m as calculated from the following:   Height as of 03/18/22: 5\' 4"  (1.626 m).   Weight as of this encounter: 94.3 kg.    Discharge Diagnoses:  Principal Problem:   Seizures ([place]) Active Problems:   Seizure ([place])   Diabetes mellitus ([place])   TIA (transient ischemic attack)   HTN (hypertension)   Gout   Thrombocytopenia ([place])   Discharge Instructions:  Activity:  As tolerated   Discharge Instructions     Diet - low sodium heart healthy   Complete by: As directed    Discharge instructions   Complete by: As directed    Follow with Primary MD  Center, [place] in 1-2 weeks  Please follow-up with a neurologist at [place] clinic in the next 1-2 weeks.  Please get a complete blood count and chemistry  panel checked by your Primary MD at your next visit, and again as instructed by your Primary MD.  Get Medicines reviewed and adjusted: Please take all your medications with you for your next visit with your Primary MD  Laboratory/radiological data: Please request your Primary MD to go over all hospital tests and procedure/radiological results at  the follow up, please ask your Primary MD to get all Hospital records sent to his/her office.  In some cases, they will be blood work, cultures and biopsy results pending at the time of your discharge. Please request that your primary care M.D. follows up on these results.  Also Note the following: If you experience worsening of your admission symptoms, develop shortness of breath, life threatening emergency, suicidal or homicidal thoughts you must seek medical attention immediately by calling 911 or calling your MD immediately  if symptoms less severe.  You must read complete instructions/literature along with all the possible adverse reactions/side effects for all the Medicines you take and that have been prescribed to you. Take any new Medicines after you have completely understood and accpet all the possible adverse reactions/side effects.   Do not drive when taking Pain medications or sleeping medications (Benzodaizepines)  Do not take more than prescribed Pain, Sleep and Anxiety Medications. It is not advisable to combine anxiety,sleep and pain medications without talking with your primary care practitioner  Special Instructions: If you have smoked or chewed Tobacco  in the last 2 yrs please stop smoking, stop any regular Alcohol  and or any Recreational drug use.  Wear Seat belts while driving.  Please note: You were cared for by a hospitalist during your hospital stay. Once you are discharged, your primary care physician will handle any further medical issues. Please note that NO REFILLS for any discharge medications will be authorized once you are discharged, as it is imperative that you return to your primary care physician (or establish a relationship with a primary care physician if you do not have one) for your post hospital discharge needs so that they can reassess your need for medications and monitor your lab values.     Seizure precautions: Per [facility] statutes,  patients with seizures are not allowed to drive until they have been seizure-free for six months and cleared by a physician    Use caution when using heavy equipment or power tools. Avoid working on ladders or at heights. Take showers instead of baths. Ensure the water temperature is not too high on the home water heater. Do not go swimming alone. Do not lock yourself in a room alone (i.e. bathroom). When caring for infants or small children, sit down when holding, feeding, or changing them to minimize risk of injury to the child in the event you have a seizure. Maintain good sleep hygiene. Avoid alcohol.    If patient has another seizure, call 911 and bring them back to the ED if: A.  The seizure lasts longer than 5 minutes.      B.  The patient doesn't wake shortly after the seizure or has new problems such as difficulty seeing, speaking or moving following the seizure C.  The patient was injured during the seizure D.  The patient has a temperature over 102 F (39C) E.  The patient vomited during the seizure and now is having trouble breathing    During the Seizure   - First, ensure adequate ventilation and place patients on the floor on their left side  Loosen clothing  around the neck and ensure the airway is patent. If the patient is clenching the teeth, do not force the mouth open with any object as this can cause severe damage - Remove all items from the surrounding that can be hazardous. The patient may be oblivious to what's happening and may not even know what he or she is doing. If the patient is confused and wandering, either gently guide him/her away and block access to outside areas - Reassure the individual and be comforting - Call 911. In most cases, the seizure ends before EMS arrives. However, there are cases when seizures may last over 3 to 5 minutes. Or the individual may have developed breathing difficulties or severe injuries. If a pregnant patient or a person with diabetes  develops a seizure, it is prudent to call an ambulance. - Finally, if the patient does not regain full consciousness, then call EMS. Most patients will remain confused for about 45 to 90 minutes after a seizure, so you must use judgment in calling for help. - Avoid restraints but make sure the patient is in a bed with padded side rails - Place the individual in a lateral position with the neck slightly flexed; this will help the saliva drain from the mouth and prevent the tongue from falling backward - Remove all nearby furniture and other hazards from the area - Provide verbal assurance as the individual is regaining consciousness - Provide the patient with privacy if possible - Call for help and start treatment as ordered by the caregiver    After the Seizure (Postictal Stage)   After a seizure, most patients experience confusion, fatigue, muscle pain and/or a headache. Thus, one should permit the individual to sleep. For the next few days, reassurance is essential. Being calm and helping reorient the person is also of importance.   Most seizures are painless and end spontaneously. Seizures are not harmful to others but can lead to complications such as stress on the lungs, brain and the heart. Individuals with prior lung problems may develop labored breathing and respiratory distress.    Increase activity slowly   Complete by: As directed       Allergies as of 03/22/2022       Reactions   Elavil [amitriptyline] Other (See Comments)   "Makes me crazy"   Elemental Sulfur Nausea Only        Medication List     STOP taking these medications    clopidogrel 75 MG tablet Commonly known as: PLAVIX   levETIRAcetam 1000 MG/100ML Soln Commonly known as: KEPPRA       TAKE these medications    acetaminophen 500 MG tablet Commonly known as: TYLENOL Take 500 mg by mouth every 6 (six) hours as needed for mild pain.   allopurinol 100 MG tablet Commonly known as: ZYLOPRIM Take 100  mg by mouth daily.   aspirin 81 MG chewable tablet Chew 81 mg by mouth daily.   Colchicine 0.6 MG Caps Take 0.6 mg by mouth daily as needed (Gout).   cyanocobalamin 1000 MCG tablet Commonly known as: VITAMIN B12 Take 1,000 mcg by mouth daily.   fluticasone 50 MCG/ACT nasal spray Commonly known as: FLONASE Place 1 spray into both nostrils 2 (two) times daily.   gabapentin 300 MG capsule Commonly known as: NEURONTIN Take 300 mg by mouth 3 (three) times daily.   levETIRAcetam 1000 MG tablet Commonly known as: KEPPRA Take 1 tablet (1,000 mg total) by mouth 2 (two) times daily.  lisinopril 20 MG tablet Commonly known as: ZESTRIL Take 20 mg by mouth daily.   meloxicam 15 MG tablet Commonly known as: MOBIC Take 15 mg by mouth daily.   polyethylene glycol powder 17 GM/SCOOP powder Commonly known as: GLYCOLAX/MIRALAX Take 17 g by mouth daily as needed for constipation.   pravastatin 80 MG tablet Commonly known as: PRAVACHOL Take 80 mg by mouth at bedtime.   Vitamin D-1000 Max St 25 MCG (1000 UT) tablet Generic drug: Cholecalciferol Take 1,000 Units by mouth daily.   Voltaren 1 % Gel Generic drug: diclofenac Sodium Apply 1 application topically daily as needed for pain.        Follow-up [place], [facility]. Schedule an appointment as soon as possible for a visit in 1 week(s).   Specialty: General Practice Contact information: [address] [place] [address] [phone]         Mason, Sarah E, PA-C. Schedule an appointment as soon as possible for a visit in 1 week(s).   Specialty: Physician Assistant Why: Call office on 11/27 for follow-up appointment. Contact information: [place] [address] [phone]                Allergies  Allergen Reactions   Elavil [Amitriptyline] Other (See Comments)    "Makes me crazy"   Elemental Sulfur Nausea Only     Other  Procedures/Studies: Overnight EEG with video  Result Date: 03/21/2022 Lora Havens, MD     03/22/2022  9:26 AM Patient Name: Krista Anderson MRN: [id number] Epilepsy Attending: Lora Havens Referring Physician/Provider: Donnetta Simpers, MD Duration: 03/21/2022 0053 to 03/22/2022 0053 Patient history: 76 y.o. female with a history of aphasia who is undergoing an EEG to evaluate for seizure Level of alertness: Awake, asleep AEDs during EEG study: LEV Technical aspects: This EEG study was done with scalp electrodes positioned according to the 10-20 International system of electrode placement. Electrical activity was reviewed with band pass filter of 1-70Hz , sensitivity of 7 uV/mm, display speed of 50mm/sec with a 60Hz  notched filter applied as appropriate. EEG data were recorded continuously and digitally stored.  Video monitoring was available and reviewed as appropriate. Description: The posterior dominant rhythm consists of 8 Hz activity of moderate voltage (25-35 uV) seen predominantly in posterior head regions, asymmetric ( left<right) and reactive to eye opening and eye closing. Sleep was characterized by vertex waves, sleep spindles (12 to 14 Hz), maximal frontocentral region. EEG showed continuous 3 to 6 Hz theta-delta slowing in left hemisphere. Hyperventilation and photic stimulation were not performed.   ABNORMALITY - Continuous slow, left hemisphere - Background asymmetry, left<right IMPRESSION: This study is suggestive of cortical dysfunction arising from left hemisphere likely secondary to underlying structural abnormality. No seizures or epileptiform discharges were seen throughout the recording. Lora Havens   MR BRAIN W WO CONTRAST  Result Date: 03/19/2022 CLINICAL DATA:  Stroke suspected or possible seizure; aphasia EXAM: MRI HEAD WITHOUT AND WITH CONTRAST TECHNIQUE: Multiplanar, multiecho pulse sequences of the brain and surrounding structures were obtained without and with  intravenous contrast. CONTRAST:  9mL GADAVIST GADOBUTROL 1 MMOL/ML IV SOLN COMPARISON:  03/19/2022 MRI head without contrast; correlation is also made with CT head 03/19/2022 FINDINGS: Brain: No abnormal parenchymal or meningeal enhancement. No restricted diffusion to suggest acute or subacute infarct. No acute hemorrhage, mass, mass effect, or midline shift. No hydrocephalus or extra-axial collection. Redemonstrated sequela of remote left basal ganglia  hemorrhage and encephalomalacia in the anterior right temporal lobe. Advanced cerebral atrophy for age, with ex vacuo dilatation of the lateral and third ventricles. Mega cisterna magna. The hippocampi are symmetric in size and signal. No heterotopia or evidence of cortical dysgenesis. Vascular: Normal arterial flow voids. Normal arterial and venous enhancement. Skull and upper cervical spine: Normal marrow signal. Sinuses/Orbits: Mucous retention cysts in the maxillary sinuses. Otherwise clear paranasal sinuses. No acute finding in the orbits. Other: Trace fluid in the bilateral mastoid air cells. IMPRESSION: No acute intracranial process. No evidence of acute or subacute infarct. No abnormal enhancement. Electronically Signed   By: Merilyn Baba M.D.   On: 03/19/2022 19:33   EEG adult  Result Date: 03/19/2022 Derek Jack, MD     03/19/2022  6:43 PM Routine EEG Report Krista Anderson is a 76 y.o. female with a history of aphasia who is undergoing an EEG to evaluate for seizures. Report: This EEG was acquired with electrodes placed according to the International 10-20 electrode system (including Fp1, Fp2, F3, F4, C3, C4, P3, P4, O1, O2, T3, T4, T5, T6, A1, A2, Fz, Cz, Pz). The following electrodes were missing or displaced: none. The occipital dominant rhythm was 8.5 Hz. This activity is reactive to stimulation. Drowsiness was manifested by background fragmentation; deeper stages of sleep were identified by K complexes and sleep spindles. There was focal  slowing over the left hemisphere in the theta range. There were no interictal epileptiform discharges. There were no electrographic seizures identified. Photic stimulation and hyperventilation were not performed. Impression and clinical correlation: This EEG was obtained while awake and asleep and is abnormal due to moderate slowing over the left hemisphere indicative of focal cerebral dysfunction in that region. Epileptiform abnormalities were not seen during this recording. Su Monks, MD [facility] [phone] If 7pm- 7am, please page neurology on call as listed in [place].   ECHOCARDIOGRAM COMPLETE  Result Date: 03/19/2022    ECHOCARDIOGRAM REPORT   Patient Name:   Krista Anderson Date of Exam: 03/19/2022 Medical Rec #:  [id number]   Height:       64.0 in Accession #:    [id number]  Weight:       200.0 lb Date of Birth:  07-18-1945   BSA:          1.956 m Patient Age:    57 years    BP:           151/78 mmHg Patient Gender: F           HR:           73 bpm. Exam Location:  [facility] Procedure: 2D Echo, Color Doppler and Cardiac Doppler Indications:     I63.9 Stroke  History:         Patient has no prior history of Echocardiogram examinations.                  Risk Factors:Hypertension.  Sonographer:     Charmayne Sheer Referring Phys:  [place] Diagnosing Phys: Buford Dresser MD  Sonographer Comments: Suboptimal apical window and suboptimal subcostal window. IMPRESSIONS  1. Left ventricular ejection fraction, by estimation, is 55 to 60%. The left ventricle has normal function. The left ventricle has no regional wall motion abnormalities. There is mild concentric left ventricular hypertrophy. Left ventricular diastolic parameters were normal.  2. Right ventricular systolic function is normal. The right ventricular size is normal.  3. The mitral valve is grossly normal. Trivial mitral  valve regurgitation.  4. The aortic valve is tricuspid. There is mild calcification of the aortic valve.  There is mild thickening of the aortic valve. Aortic valve regurgitation is not visualized. Aortic valve sclerosis/calcification is present, without any evidence of aortic stenosis.  5. The inferior vena cava is normal in size with <50% respiratory variability, suggesting right atrial pressure of 8 mmHg. Comparison(s): No prior Echocardiogram. Conclusion(s)/Recommendation(s): Normal biventricular function without evidence of hemodynamically significant valvular heart disease. No intracardiac source of embolism detected on this transthoracic study. Consider a transesophageal echocardiogram to exclude cardiac source of embolism if clinically indicated. FINDINGS  Left Ventricle: Left ventricular ejection fraction, by estimation, is 55 to 60%. The left ventricle has normal function. The left ventricle has no regional wall motion abnormalities. The left ventricular internal cavity size was normal in size. There is  mild concentric left ventricular hypertrophy. Left ventricular diastolic parameters were normal. Right Ventricle: The right ventricular size is normal. No increase in right ventricular wall thickness. Right ventricular systolic function is normal. Left Atrium: Left atrial size was normal in size. Right Atrium: Right atrial size was normal in size. Pericardium: There is no evidence of pericardial effusion. Presence of epicardial fat layer. Mitral Valve: The mitral valve is grossly normal. Trivial mitral valve regurgitation. Tricuspid Valve: The tricuspid valve is grossly normal. Tricuspid valve regurgitation is trivial. Aortic Valve: The aortic valve is tricuspid. There is mild calcification of the aortic valve. There is mild thickening of the aortic valve. Aortic valve regurgitation is not visualized. Aortic valve sclerosis/calcification is present, without any evidence of aortic stenosis. Aortic valve mean gradient measures 4.0 mmHg. Aortic valve peak gradient measures 8.2 mmHg. Aortic valve area, by VTI  measures 1.61 cm. Pulmonic Valve: The pulmonic valve was not well visualized. Pulmonic valve regurgitation is trivial. No evidence of pulmonic stenosis. Aorta: The aortic root, ascending aorta, aortic arch and descending aorta are all structurally normal, with no evidence of dilitation or obstruction. Venous: The inferior vena cava is normal in size with less than 50% respiratory variability, suggesting right atrial pressure of 8 mmHg. IAS/Shunts: The interatrial septum was not well visualized.  LEFT VENTRICLE PLAX 2D LVIDd:         4.10 cm   Diastology LVIDs:         2.90 cm   LV e' medial:    7.94 cm/s LV PW:         1.20 cm   LV E/e' medial:  12.8 LV IVS:        1.33 cm   LV e' lateral:   7.18 cm/s LVOT diam:     1.70 cm   LV E/e' lateral: 14.2 LV SV:         46 LV SV Index:   24 LVOT Area:     2.27 cm  RIGHT VENTRICLE RV Basal diam:  3.10 cm RV S prime:     17.90 cm/s LEFT ATRIUM             Index        RIGHT ATRIUM           Index LA diam:        3.10 cm 1.58 cm/m   RA Area:     10.90 cm LA Vol (A2C):   21.4 ml 10.94 ml/m  RA Volume:   22.20 ml  11.35 ml/m LA Vol (A4C):   22.4 ml 11.45 ml/m LA Biplane Vol: 23.9 ml 12.22 ml/m  AORTIC VALVE  PULMONIC VALVE AV Area (Vmax):    1.55 cm     PV Vmax:       1.03 m/s AV Area (Vmean):   1.56 cm     PV Vmean:      70.800 cm/s AV Area (VTI):     1.61 cm     PV VTI:        0.204 m AV Vmax:           143.00 cm/s  PV Peak grad:  4.2 mmHg AV Vmean:          96.200 cm/s  PV Mean grad:  2.0 mmHg AV VTI:            0.287 m AV Peak Grad:      8.2 mmHg AV Mean Grad:      4.0 mmHg LVOT Vmax:         97.40 cm/s LVOT Vmean:        66.000 cm/s LVOT VTI:          0.204 m LVOT/AV VTI ratio: 0.71  AORTA Ao Root diam: 3.10 cm MITRAL VALVE MV Area (PHT): 4.80 cm     SHUNTS MV Decel Time: 158 msec     Systemic VTI:  0.20 m MV E velocity: 102.00 cm/s  Systemic Diam: 1.70 cm MV A velocity: 142.00 cm/s MV E/A ratio:  0.72 Buford Dresser MD Electronically  signed by Buford Dresser MD Signature Date/Time: 03/19/2022/6:35:54 PM    Final    CT ANGIO HEAD NECK W WO CM W PERF (CODE STROKE)  Result Date: 03/19/2022 CLINICAL DATA:  76 year old female code stroke presentation this morning. EXAM: CT ANGIOGRAPHY HEAD AND NECK CT PERFUSION BRAIN CT VENOGRAM HEAD TECHNIQUE: Multidetector CT imaging of the head and neck was performed using the standard protocol during bolus administration of intravenous contrast. Multiplanar CT image reconstructions and MIPs were obtained to evaluate the vascular anatomy. Carotid stenosis measurements (when applicable) are obtained utilizing NASCET criteria, using the distal internal carotid diameter as the denominator. Multiphase CT imaging of the brain was performed following IV bolus contrast injection. Subsequent parametric perfusion maps were calculated using RAPID software. Venographic phase images of the brain were obtained following the administration of intravenous contrast. Multiplanar reformats and maximum intensity projections were generated. RADIATION DOSE REDUCTION: This exam was performed according to the departmental dose-optimization program which includes automated exposure control, adjustment of the mA and/or kV according to patient size and/or use of iterative reconstruction technique. CONTRAST:  168mL OMNIPAQUE IOHEXOL 350 MG/ML SOLN COMPARISON:  Plain head CT 0822 hours. FINDINGS: CT Brain Perfusion Findings: ASPECTS: 10 CBF (<30%) Volume: 51mL Perfusion (Tmax>6.0s) volume: 36mL No CBF, CBV or T-max abnormal parenchyma detected. Mismatch Volume: Not applicable Infarction Location:Not applicable CTA NECK Skeleton: Mild for age cervical spine degeneration. No acute osseous abnormality identified. Upper chest: Negative. Other neck: 13 mm right thyroid nodule , subcentimeter nodule on the left. Not clinically significant and no follow-up imaging recommended. (Ref: J Am Coll Radiol. 2015 Feb;12(2): 143-50).Other neck  soft tissue spaces appear within normal limits. Aortic arch: Calcified aortic atherosclerosis. 3 vessel arch configuration. Right carotid system: Mild brachiocephalic artery soft and calcified plaque without stenosis. Tortuous proximal right CCA with no plaque or stenosis. Soft and calcified plaque at the right ICA origin and bulb with up to 50 % stenosis with respect to the distal vessel. Left carotid system: Little plaque at the left CCA origin but widespread soft and calcified plaque along the medial vessel before  the bifurcation. No significant left CCA stenosis. Confluent calcified plaque at the left ICA origin and bulb with up to 50 % stenosis with respect to the distal vessel. Vertebral arteries: Proximal right subclavian artery plaque including involvement of the right vertebral artery origin with mild stenosis (series 9, image 201). Mildly dominant right vertebral otherwise patent to the skull base with no plaque or stenosis. Proximal left subclavian artery plaque without stenosis. Normal left vertebral artery origin. Non dominant left vertebral artery is patent to the skull base with no plaque or stenosis. CTA HEAD Posterior circulation: Dominant right V4 segment with bulky calcified plaque and moderate to severe stenosis (series 9, image 182. But the vessel remains patent to the vertebrobasilar junction. Non dominant left vertebral V4 segment demonstrates mild plaque, mild stenosis, and patent vertebrobasilar junction. Left PICA origin is patent. Right AICA is patent and may be dominant. Patent basilar artery without stenosis. Patent SCA and PCA origins. Posterior communicating arteries are diminutive or absent. Right PCA branches are within normal limits. Left PCA P1 segment is tortuous and irregular with up to moderate stenosis on series 11, image 49. Left PCA branches are otherwise within normal limits. Anterior circulation: Both ICA siphons are patent. Mild to moderate left siphon calcified plaque  most pronounced in the supraclinoid segment. Mild to moderate supraclinoid stenosis on the left. Similar right siphon calcified plaque with mild to moderate supraclinoid stenosis. A small vessel infundibulum at the right ICA terminus is suspected on series 8, images 112 and 113. A small 3 mm saccular aneurysm is less likely. Patent carotid termini, MCA and ACA origins. Anterior communicating artery and bilateral ACA branches are within normal limits. Left MCA M1 segment and bifurcation are patent with only mild irregularity and stenosis. Left MCA branches are within normal limits. Right MCA M1 segment and bifurcation are patent without stenosis, mild tortuosity. Right MCA branches are within normal limits. CTV HEAD Venous sinuses: Dedicated CT venogram images at 0850 hours. Dural venous sinuses, vein of Galen, internal cerebral veins, cavernous sinuses are enhancing and patent. IJ bulbs are enhancing and patent. Anatomic variants: Mildly dominant right vertebral artery. Review of the MIP images confirms the above findings IMPRESSION: 1. Negative for large vessel occlusion. Negative CT Perfusion. Negative CT Venogram. 2. 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 - moderate stenosis Left PCA P1 segment stenosis. - bilateral carotid bifurcation plaque with up to 50% proximal ICA stenosis. 3. Positive also for a small 3 mm outpouching at the distal Right ICA. But favor incidental vessel infundibulum rather than unruptured saccular aneurysm. Electronically Signed   By: Genevie Ann M.D.   On: 03/19/2022 09:07   CT VENOGRAM HEAD  Result Date: 03/19/2022 CLINICAL DATA:  76 year old female code stroke presentation this morning. EXAM: CT ANGIOGRAPHY HEAD AND NECK CT PERFUSION BRAIN CT VENOGRAM HEAD TECHNIQUE: Multidetector CT  imaging of the head and neck was performed using the standard protocol during bolus administration of intravenous contrast. Multiplanar CT image reconstructions and MIPs were obtained to evaluate the vascular anatomy. Carotid stenosis measurements (when applicable) are obtained utilizing NASCET criteria, using the distal internal carotid diameter as the denominator. Multiphase CT imaging of the brain was performed following IV bolus contrast injection. Subsequent parametric perfusion maps were calculated using RAPID software. Venographic phase images of the brain were obtained following  the administration of intravenous contrast. Multiplanar reformats and maximum intensity projections were generated. RADIATION DOSE REDUCTION: This exam was performed according to the departmental dose-optimization program which includes automated exposure control, adjustment of the mA and/or kV according to patient size and/or use of iterative reconstruction technique. CONTRAST:  142mL OMNIPAQUE IOHEXOL 350 MG/ML SOLN COMPARISON:  Plain head CT 0822 hours. FINDINGS: CT Brain Perfusion Findings: ASPECTS: 10 CBF (<30%) Volume: 51mL Perfusion (Tmax>6.0s) volume: 59mL No CBF, CBV or T-max abnormal parenchyma detected. Mismatch Volume: Not applicable Infarction Location:Not applicable CTA NECK Skeleton: Mild for age cervical spine degeneration. No acute osseous abnormality identified. Upper chest: Negative. Other neck: 13 mm right thyroid nodule , subcentimeter nodule on the left. Not clinically significant and no follow-up imaging recommended. (Ref: J Am Coll Radiol. 2015 Feb;12(2): 143-50).Other neck soft tissue spaces appear within normal limits. Aortic arch: Calcified aortic atherosclerosis. 3 vessel arch configuration. Right carotid system: Mild brachiocephalic artery soft and calcified plaque without stenosis. Tortuous proximal right CCA with no plaque or stenosis. Soft and calcified plaque at the right ICA origin and bulb with up to  50 % stenosis with respect to the distal vessel. Left carotid system: Little plaque at the left CCA origin but widespread soft and calcified plaque along the medial vessel before the bifurcation. No significant left CCA stenosis. Confluent calcified plaque at the left ICA origin and bulb with up to 50 % stenosis with respect to the distal vessel. Vertebral arteries: Proximal right subclavian artery plaque including involvement of the right vertebral artery origin with mild stenosis (series 9, image 201). Mildly dominant right vertebral otherwise patent to the skull base with no plaque or stenosis. Proximal left subclavian artery plaque without stenosis. Normal left vertebral artery origin. Non dominant left vertebral artery is patent to the skull base with no plaque or stenosis. CTA HEAD Posterior circulation: Dominant right V4 segment with bulky calcified plaque and moderate to severe stenosis (series 9, image 182. But the vessel remains patent to the vertebrobasilar junction. Non dominant left vertebral V4 segment demonstrates mild plaque, mild stenosis, and patent vertebrobasilar junction. Left PICA origin is patent. Right AICA is patent and may be dominant. Patent basilar artery without stenosis. Patent SCA and PCA origins. Posterior communicating arteries are diminutive or absent. Right PCA branches are within normal limits. Left PCA P1 segment is tortuous and irregular with up to moderate stenosis on series 11, image 49. Left PCA branches are otherwise within normal limits. Anterior circulation: Both ICA siphons are patent. Mild to moderate left siphon calcified plaque most pronounced in the supraclinoid segment. Mild to moderate supraclinoid stenosis on the left. Similar right siphon calcified plaque with mild to moderate supraclinoid stenosis. A small vessel infundibulum at the right ICA terminus is suspected on series 8, images 112 and 113. A small 3 mm saccular aneurysm is less likely. Patent carotid  termini, MCA and ACA origins. Anterior communicating artery and bilateral ACA branches are within normal limits. Left MCA M1 segment and bifurcation are patent with only mild irregularity and stenosis. Left MCA branches are within normal limits. Right MCA M1 segment and bifurcation are patent without stenosis, mild tortuosity. Right MCA branches are within normal limits. CTV HEAD Venous sinuses: Dedicated CT venogram images at 0850 hours. Dural venous sinuses, vein of Galen, internal cerebral veins, cavernous sinuses are enhancing and patent. IJ bulbs are enhancing and patent. Anatomic variants: Mildly dominant right vertebral artery. Review of the MIP images confirms the above findings IMPRESSION: 1. Negative for  large vessel occlusion. Negative CT Perfusion. Negative CT Venogram. 2. 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 - moderate stenosis Left PCA P1 segment stenosis. - bilateral carotid bifurcation plaque with up to 50% proximal ICA stenosis. 3. Positive also for a small 3 mm outpouching at the distal Right ICA. But favor incidental vessel infundibulum rather than unruptured saccular aneurysm. Electronically Signed   By: Genevie Ann M.D.   On: 03/19/2022 09:07   CT HEAD CODE STROKE WO CONTRAST  Result Date: 03/19/2022 CLINICAL DATA:  Code stroke. Neuro deficit, acute, stroke suspected. Headache. Patient admitted yesterday. Severe aphasia and dysarthria this morning. EXAM: CT HEAD WITHOUT CONTRAST TECHNIQUE: Contiguous axial images were obtained from the base of the skull through the vertex without intravenous contrast. RADIATION DOSE REDUCTION: This exam was performed according to the departmental dose-optimization program which includes automated exposure control, adjustment of the mA and/or kV according to  patient size and/or use of iterative reconstruction technique. COMPARISON:  CT head without contrast 03/18/2022. MR head without contrast 03/19/2022. FINDINGS: Brain: No acute infarct, hemorrhage, or mass lesion is present. Stable atrophy and white matter disease. Ventricular enlargement is stable. 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 Remote encephalomalacia of the right temporal tip noted. No significant extraaxial fluid collection is present. The brainstem and cerebellum are within normal limits. Vascular: Atherosclerotic calcifications are again noted within the cavernous internal carotid arteries bilaterally and at the dural margin of both vertebral arteries. No hyperdense vessel is present. Skull: Calvarium is intact. No focal lytic or blastic lesions are present. No significant extracranial soft tissue lesion is present. Sinuses/Orbits: The paranasal sinuses and mastoid air cells are clear. The globes and orbits are within normal limits. ASPECTS [facility] Stroke Program Early CT Score) - Ganglionic level infarction (caudate, lentiform nuclei, internal capsule, insula, M1-M3 cortex): 7/7 - Supraganglionic infarction (M4-M6 cortex): 3/3 Total score (0-10 with 10 being normal): 10/10 IMPRESSION: 1. No acute intracranial abnormality or significant interval change. 2. Stable atrophy and white matter disease. This likely reflects the sequela of chronic microvascular ischemia. 3. 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 4. Remote encephalomalacia of the right temporal tip. 5. Aspects is 10/10. The above was relayed via text pager to Dr. Quinn Axe On 03/19/2022 at 08:27 . Electronically Signed   By: San Morelle M.D.   On: 03/19/2022 08:28   MR BRAIN WO CONTRAST  Result Date: 03/19/2022 CLINICAL DATA:  Some onset dizziness, slurred speech, and right arm numbness EXAM: MRI HEAD WITHOUT CONTRAST TECHNIQUE: Multiplanar, multiecho pulse sequences of  the brain and surrounding structures were obtained without intravenous contrast. COMPARISON:  03/04/2020 MRI head, correlation is also made with 03/18/2022 CT head FINDINGS: Brain: No restricted diffusion to suggest acute or subacute infarct. No acute hemorrhage, mass, mass effect, midline shift. No hydrocephalus or extra-axial collection. 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 Redemonstrated encephalomalacia in the anterior right temporal lobe. Advanced cerebral atrophy for age, with ex vacuo dilatation of lateral and third ventricles. Redemonstrated mega cisterna magna. Vascular: Normal arterial flow voids. Skull and upper cervical spine: Normal marrow signal. Sinuses/Orbits: Mucous retention cysts in the maxillary sinuses. The orbits are unremarkable. Other: Trace fluid in the bilateral mastoid air cells. IMPRESSION: No acute intracranial process. No evidence of acute or subacute  infarct. Electronically Signed   By: Merilyn Baba M.D.   On: 03/19/2022 01:27   CT HEAD WO CONTRAST (5MM)  Result Date: 03/18/2022 CLINICAL DATA:  Acute neurological deficit. Stroke suspected. Patient was not making sense while talking to the family. EXAM: CT HEAD WITHOUT CONTRAST TECHNIQUE: Contiguous axial images were obtained from the base of the skull through the vertex without intravenous contrast. RADIATION DOSE REDUCTION: This exam was performed according to the departmental dose-optimization program which includes automated exposure control, adjustment of the mA and/or kV according to patient size and/or use of iterative reconstruction technique. COMPARISON:  MRI brain 03/04/2020.  CT temporal bones 10/07/2018 FINDINGS: Brain: Diffuse cerebral atrophy. Prominent ventricular dilatation is similar to prior study and likely represents central atrophy. Prominent cisterna magna. No mass-effect or midline shift. No abnormal extra-axial fluid collections. Gray-white matter junctions are distinct. 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 Basal cisterns are not effaced. No acute intracranial hemorrhage. Vascular: Vascular calcifications.  No acute changes. Skull: Calvarium appears intact. Sinuses/Orbits: Paranasal sinuses and mastoid air cells are clear. Other: None. IMPRESSION: 1. No acute intracranial abnormalities. 2. Chronic atrophy and small vessel ischemic changes. Electronically Signed   By: Lucienne Capers M.D.   On: 03/18/2022 15:57     TODAY-DAY OF DISCHARGE:  Subjective:   Krista Anderson today has no headache,no chest abdominal pain,no new weakness tingling or numbness, feels much better wants to go home today.   Objective:   Blood pressure (!) 177/66, pulse 67, temperature (!) 97.5 F (36.4 C), resp. rate 16, weight 94.3 kg, SpO2 97 %.  Intake/Output Summary (Last 24 hours) at 03/22/2022 1117 Last data filed at 03/22/2022 0800 Gross per 24 hour  Intake 1280 ml  Output 1800 ml  Net -520 ml   Filed Weights   03/20/22 2235  Weight: 94.3 kg    Exam: Awake Alert, Oriented *3, No new F.N deficits, Normal affect .AT,PERRAL Supple Neck,No JVD, No cervical lymphadenopathy appriciated.  Symmetrical Chest wall movement, Good air movement bilaterally, CTAB RRR,No Gallops,Rubs or new Murmurs, No Parasternal Heave +ve B.Sounds, Abd Soft, Non tender, No organomegaly appriciated, No rebound -guarding or rigidity. No Cyanosis, Clubbing or edema, No new Rash or bruise   PERTINENT RADIOLOGIC STUDIES: Overnight EEG with video  Result Date: 03/21/2022 Lora Havens, MD     03/22/2022  9:26 AM Patient Name: Krista Anderson MRN: [id number] Epilepsy Attending: Lora Havens Referring Physician/Provider: Donnetta Simpers, MD Duration: 03/21/2022 0053 to 03/22/2022 0053 Patient history: 76 y.o. female with a history of aphasia who is undergoing an EEG to evaluate for seizure Level of alertness: Awake, asleep AEDs during EEG study: LEV Technical aspects: This  EEG study was done with scalp electrodes positioned according to the 10-20 International system of electrode placement. Electrical activity was reviewed with band pass filter of 1-70Hz , sensitivity of 7 uV/mm, display speed of 35mm/sec with a 60Hz  notched filter applied as appropriate. EEG data were recorded continuously and digitally stored.  Video monitoring was available and reviewed as appropriate. Description: The posterior dominant rhythm consists of 8 Hz activity of moderate voltage (25-35 uV) seen predominantly in posterior head regions, asymmetric ( left<right) and reactive to eye opening and eye closing. Sleep was characterized by vertex waves, sleep spindles (12 to 14 Hz), maximal frontocentral region. EEG showed continuous 3 to 6 Hz theta-delta slowing in left hemisphere. Hyperventilation and photic stimulation were not performed.   ABNORMALITY - Continuous slow, left hemisphere -  Background asymmetry, left<right IMPRESSION: This study is suggestive of cortical dysfunction arising from left hemisphere likely secondary to underlying structural abnormality. No seizures or epileptiform discharges were seen throughout the recording. Krista Anderson     PERTINENT LAB RESULTS: CBC: Recent Labs    03/20/22 0627 03/21/22 0331  WBC 7.8 8.2  HGB 12.6 13.1  HCT 38.6 39.4  PLT 107* 106*   CMET CMP     Component Value Date/Time   NA 138 03/20/2022 0627   K 3.6 03/20/2022 0627   CL 109 03/20/2022 0627   CO2 24 03/20/2022 0627   GLUCOSE 117 (H) 03/20/2022 0627   BUN 12 03/20/2022 0627   CREATININE 0.75 03/20/2022 0627   CALCIUM 8.6 (L) 03/20/2022 0627   PROT 7.3 03/18/2022 1541   ALBUMIN 4.0 03/18/2022 1541   AST 22 03/18/2022 1541   ALT 18 03/18/2022 1541   ALKPHOS 57 03/18/2022 1541   BILITOT 0.9 03/18/2022 1541   GFRNONAA >60 03/20/2022 0627   GFRAA >60 02/21/2016 0936    GFR Estimated Creatinine Clearance: 67.6 mL/min (by C-G formula based on SCr of 0.75 mg/dL). No results for  input(s): "LIPASE", "AMYLASE" in the last 72 hours. No results for input(s): "CKTOTAL", "CKMB", "CKMBINDEX", "TROPONINI" in the last 72 hours. Invalid input(s): "POCBNP" No results for input(s): "DDIMER" in the last 72 hours. No results for input(s): "HGBA1C" in the last 72 hours. No results for input(s): "CHOL", "HDL", "LDLCALC", "TRIG", "CHOLHDL", "LDLDIRECT" in the last 72 hours. No results for input(s): "TSH", "T4TOTAL", "T3FREE", "THYROIDAB" in the last 72 hours.  Invalid input(s): "FREET3" No results for input(s): "VITAMINB12", "FOLATE", "FERRITIN", "TIBC", "IRON", "RETICCTPCT" in the last 72 hours. Coags: No results for input(s): "INR" in the last 72 hours.  Invalid input(s): "PT" Microbiology: No results found for this or any previous visit (from the past 240 hour(s)).  FURTHER DISCHARGE INSTRUCTIONS:  Get Medicines reviewed and adjusted: Please take all your medications with you for your next visit with your Primary MD  Laboratory/radiological data: Please request your Primary MD to go over all hospital tests and procedure/radiological results at the follow up, please ask your Primary MD to get all Hospital records sent to his/her office.  In some cases, they will be blood work, cultures and biopsy results pending at the time of your discharge. Please request that your primary care M.D. goes through all the records of your hospital data and follows up on these results.  Also Note the following: If you experience worsening of your admission symptoms, develop shortness of breath, life threatening emergency, suicidal or homicidal thoughts you must seek medical attention immediately by calling 911 or calling your MD immediately  if symptoms less severe.  You must read complete instructions/literature along with all the possible adverse reactions/side effects for all the Medicines you take and that have been prescribed to you. Take any new Medicines after you have completely  understood and accpet all the possible adverse reactions/side effects.   Do not drive when taking Pain medications or sleeping medications (Benzodaizepines)  Do not take more than prescribed Pain, Sleep and Anxiety Medications. It is not advisable to combine anxiety,sleep and pain medications without talking with your primary care practitioner  Special Instructions: If you have smoked or chewed Tobacco  in the last 2 yrs please stop smoking, stop any regular Alcohol  and or any Recreational drug use.  Wear Seat belts while driving.  Please note: You were cared for by a hospitalist during your hospital  stay. Once you are discharged, your primary care physician will handle any further medical issues. Please note that NO REFILLS for any discharge medications will be authorized once you are discharged, as it is imperative that you return to your primary care physician (or establish a relationship with a primary care physician if you do not have one) for your post hospital discharge needs so that they can reassess your need for medications and monitor your lab values.  Total Time spent coordinating discharge including counseling, education and face to face time equals greater than 30 minutes.  SignedOren Binet 03/22/2022 11:17 AM

## 2022-03-22 NOTE — Evaluation (Signed)
Occupational Therapy Evaluation Patient Details Name: Krista Anderson MRN: [id number] DOB: 26-Jul-1945 Today's Date: 03/22/2022   History of Present Illness 76 y.o. female with medical history significant of hypertension, diet-controlled type 2 diabetes, obesity (BMI 34.33), gout admitted to [facility] on 11/22 for slurred speech, right arm numbness which had resolved and was thought to be due to TIA.  Brain MRI ruled out an acute stroke. On 11/23, code stroke was called for aphasia/dysarthria. 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 Transferred to [facility] for continuous EEG.   Clinical Impression   PTA, pt's son lived with her and she was independent. Upon eval, pt performing standing activities at supervision level. Cognition difficult to assess due difficulties with communication, but pt able to follow up to three step command. Pt did appear to have decreased visual attention and attention to environment as well as decreased peripheral vision/attention in the L lower quadrant. Recommending OP OT for continued OT services to optimize safety and independence in ADL and IADL.      Recommendations for follow up therapy are one component of a multi-disciplinary discharge planning process, led by the attending physician.  Recommendations may be updated based on patient status, additional functional criteria and insurance authorization.   Follow Up Recommendations  Outpatient OT     Assistance Recommended at Discharge Intermittent Supervision/Assistance  Patient can return home with the following A little help with walking and/or transfers;A little help with bathing/dressing/bathroom;Assistance with cooking/housework;Direct supervision/assist for medications management;Direct supervision/assist for financial management;Assist for transportation;Help with stairs or ramp for entrance    Functional  Status Assessment  Patient has had a recent decline in their functional status and demonstrates the ability to make significant improvements in function in a reasonable and predictable amount of time.  Equipment Recommendations  BSC/3in1    Recommendations for Other Services       Precautions / Restrictions Precautions Precautions: Other (comment) Precaution Comments: EEG, telemetry, IV Restrictions Weight Bearing Restrictions: No      Mobility Bed Mobility Overal bed mobility: Independent                  Transfers Overall transfer level: Needs assistance Equipment used: None Transfers: Sit to/from Stand Sit to Stand: Supervision           General transfer comment: supervision for safety only; pt denied dizziness or imbalance      Balance Overall balance assessment: No apparent balance deficits (not formally assessed)                                         ADL either performed or assessed with clinical judgement   ADL Overall ADL's : Needs assistance/impaired Eating/Feeding: Modified independent;Sitting   Grooming: Oral care;Supervision/safety;Standing   Upper Body Bathing: Set up;Sitting   Lower Body Bathing: Supervison/ safety;Sit to/from stand   Upper Body Dressing : Set up;Sitting   Lower Body Dressing: Supervision/safety;Sit to/from stand   Toilet Transfer: Supervision/safety   Toileting- Architect and Hygiene: Supervision/safety   Tub/ Shower Transfer: Tub transfer;Supervision/safety;Ambulation Tub/Shower Transfer Details (indicate cue type and reason): Perfrorming during session with supervision (simulated). reporting she likes to lay in the bath, and recommending perhaps wait until she has been to OP therapies, or perform when daughter is present. Pt agreeable Functional mobility during ADLs: Supervision/safety  Vision Baseline Vision/History: 0 No visual deficits Patient Visual Report: No change from  baseline Vision Assessment?: Vision impaired- to be further tested in functional context Additional Comments: Able to track; decreased visual fields in R lower quadrant, unsure if due to decreased visual attention.     Perception Perception Perception Tested?: No   Praxis Praxis Praxis tested?: Not tested    Pertinent Vitals/Pain Pain Assessment Pain Assessment: Faces Faces Pain Scale: No hurt Pain Intervention(s): Monitored during session     Hand Dominance Right   Extremity/Trunk Assessment Upper Extremity Assessment Upper Extremity Assessment: Generalized weakness   Lower Extremity Assessment Lower Extremity Assessment: Defer to PT evaluation   Cervical / Trunk Assessment Cervical / Trunk Assessment: Other exceptions Cervical / Trunk Exceptions: overweight   Communication Communication Communication: Expressive difficulties   Cognition Arousal/Alertness: Awake/alert Behavior During Therapy: Flat affect Overall Cognitive Status: Difficult to assess Area of Impairment: Following commands, Problem solving, Safety/judgement, Awareness, Memory, Attention                   Current Attention Level: Sustained, Selective Memory: Decreased short-term memory Following Commands: Follows one step commands consistently, Follows one step commands with increased time, Follows multi-step commands inconsistently Safety/Judgement: Decreased awareness of safety, Decreased awareness of deficits Awareness: Emergent Problem Solving: Slow processing, Difficulty sequencing, Requires verbal cues General Comments: oriented to self, location (and prior hospital she went to), month, situation. Able to follow three step commands with increased time. Intermittently seems to need increased time for processing and for following commands. Decreased visual attention during visual testing.     General Comments       Exercises     Shoulder Instructions      Home Living Family/patient  expects to be discharged to:: Private residence Living Arrangements: Children (son lives with her) Available Help at Discharge: Family;Available 24 hours/day Type of Home: House Home Access: Stairs to enter [employer] of Steps: 2 Entrance Stairs-Rails: None Home Layout: One level               Home Equipment: None          Prior Functioning/Environment Prior Level of Function : Independent/Modified Independent             Mobility Comments: walks without device; drives; does grocery shopping; son helps with some chores but not because she cannot do them ADLs Comments: son does yardwork, otherwise reports she is independent        OT Problem List: Decreased strength;Decreased activity tolerance;Impaired balance (sitting and/or standing);Decreased safety awareness;Decreased knowledge of use of DME or AE;Decreased cognition      OT Treatment/Interventions: Self-care/ADL training;Therapeutic exercise;Therapeutic activities;DME and/or AE instruction;Manual therapy;Balance training;Patient/family education;Energy conservation;Cognitive remediation/compensation    OT Goals(Current goals can be found in the care plan section) Acute Rehab OT Goals Patient Stated Goal: go home OT Goal Formulation: With patient Time For Goal Achievement: 04/05/22 Potential to Achieve Goals: Good  OT Frequency: Min 2X/week    Co-evaluation              AM-PAC OT "6 Clicks" Daily Activity     Outcome Measure Help from another person eating meals?: None Help from another person taking care of personal grooming?: A Little Help from another person toileting, which includes using toliet, bedpan, or urinal?: A Little Help from another person bathing (including washing, rinsing, drying)?: A Little Help from another person to put on and taking off regular upper body clothing?: A Little Help from another person  to put on and taking off regular lower body clothing?: A Little 6 Click  Score: 19   End of Session Equipment Utilized During Treatment: Gait belt Nurse Communication: Mobility status  Activity Tolerance: Patient tolerated treatment well Patient left: in bed;with call bell/phone within reach;with bed alarm set  OT Visit Diagnosis: Unsteadiness on feet (R26.81);Repeated falls (R29.6);Muscle weakness (generalized) (M62.81)                Time: 7858-8502 OT Time Calculation (min): 21 min Charges:  OT General Charges $OT Visit: 1 Visit OT Evaluation $OT Eval Moderate Complexity: 1 Mod  Tyler Deis, OTR/L [facility] Acute Rehabilitation Office: [phone]   Myrla Halsted 03/22/2022, 2:40 PM

## 2022-03-22 NOTE — Evaluation (Signed)
Physical Therapy Evaluation Patient Details Name: Krista Anderson MRN: [id number] DOB: 1945/04/30 Today's Date: 03/22/2022  History of Present Illness  76 y.o. female with medical history significant of hypertension, diet-controlled type 2 diabetes, obesity (BMI 34.33), gout admitted to [facility] on 11/22 for slurred speech, right arm numbness which had resolved and was thought to be due to TIA.  Brain MRI ruled out an acute stroke. On 11/23, code stroke was called for aphasia/dysarthria. 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 Transferred to [facility] for continuous EEG.  Clinical Impression   Pt admitted secondary to problem above with deficits below. PTA patient was independent at home with son living with her. She was driving and doing grocery shopping.  Pt currently requires supervision for safety (in fairness, pt required close-guarding due to multiple lines (EEG and IV pole) and due to limited space to mobilize in her room related to EEG). PT to follow to further assess gait and stairs when no longer connected to EEG machine. Anticipate she will be at or near her baseline for mobility with no PT or DME needs prior to discharge.  Anticipate patient will benefit from PT to address problems listed below.Will continue to follow acutely to maximize functional mobility independence and safety.          Recommendations for follow up therapy are one component of a multi-disciplinary discharge planning process, led by the attending physician.  Recommendations may be updated based on patient status, additional functional criteria and insurance authorization.  Follow Up Recommendations No PT follow up      Assistance Recommended at Discharge Set up Supervision/Assistance  Patient can return home with the following  A little help with walking and/or transfers;Help with stairs or ramp for  entrance;Assist for transportation    Equipment Recommendations None recommended by PT  Recommendations for Other Services  OT consult    Functional Status Assessment Patient has had a recent decline in their functional status and demonstrates the ability to make significant improvements in function in a reasonable and predictable amount of time. (difficult to assess due to limited area for gait assessment)     Precautions / Restrictions Precautions Precautions: Other (comment) Precaution Comments: EEG, telemetry, IV      Mobility  Bed Mobility Overal bed mobility: Independent             General bed mobility comments: supine to side to sit    Transfers Overall transfer level: Needs assistance Equipment used: None Transfers: Sit to/from Stand Sit to Stand: Supervision           General transfer comment: supervision for safety only; pt denied dizziness or imbalance    Ambulation/Gait Ambulation/Gait assistance: Supervision, Independent Gait Distance (Feet): 12 Feet Assistive device: None Gait Pattern/deviations: Step-through pattern, Decreased stride length       General Gait Details: limited distance due to EEG wires/cord and IV and room set-up. Patient able to negotiate small spaces without UE support and no imbalance  Stairs            Wheelchair Mobility    Modified Rankin (Stroke Patients Only)       Balance Overall balance assessment: No apparent balance deficits (not formally assessed)  Pertinent Vitals/Pain Pain Assessment Pain Assessment: Faces Faces Pain Scale: No hurt    Home Living Family/patient expects to be discharged to:: Private residence Living Arrangements: Children (son lives with her) Available Help at Discharge: Family;Available 24 hours/day Type of Home: House Home Access: Stairs to enter Entrance Stairs-Rails: None Entrance Stairs-Number of Steps: 2    Home Layout: One level Home Equipment: None      Prior Function Prior Level of Function : Independent/Modified Independent             Mobility Comments: walks without device; drives; does grocery shopping; son helps with some chores but not because she cannot do them ADLs Comments: son does yardwork, otherwise reports she is independent     Hand Dominance   Dominant Hand: Right    Extremity/Trunk Assessment   Upper Extremity Assessment Upper Extremity Assessment: Defer to OT evaluation    Lower Extremity Assessment Lower Extremity Assessment: Generalized weakness    Cervical / Trunk Assessment Cervical / Trunk Assessment: Other exceptions Cervical / Trunk Exceptions: overweight  Communication   Communication: Expressive difficulties  Cognition Arousal/Alertness: Awake/alert Behavior During Therapy: Flat affect Overall Cognitive Status: Difficult to assess                                 General Comments: oriented to self, location (and prior hospital she went to), month, situation        General Comments General comments (skin integrity, edema, etc.): Up to chair and noted to be in field of view of EEG camera.    Exercises     Assessment/Plan    PT Assessment Patient needs continued PT services  PT Problem List Decreased strength;Decreased mobility;Decreased knowledge of use of DME       PT Treatment Interventions DME instruction;Functional mobility training;Balance training;Patient/family education;Gait training;Therapeutic activities;Stair training;Therapeutic exercise    PT Goals (Current goals can be found in the Care Plan section)  Acute Rehab PT Goals Patient Stated Goal: go home today PT Goal Formulation: With patient Time For Goal Achievement: 04/05/22 Potential to Achieve Goals: Good    Frequency Min 2X/week     Co-evaluation               AM-PAC PT "6 Clicks" Mobility  Outcome Measure Help needed turning from  your back to your side while in a flat bed without using bedrails?: None Help needed moving from lying on your back to sitting on the side of a flat bed without using bedrails?: None Help needed moving to and from a bed to a chair (including a wheelchair)?: A Little Help needed standing up from a chair using your arms (e.g., wheelchair or bedside chair)?: A Little Help needed to walk in hospital room?: A Little Help needed climbing 3-5 steps with a railing? : A Little 6 Click Score: 20    End of Session Equipment Utilized During Treatment: Gait belt Activity Tolerance: Patient tolerated treatment well Patient left: in chair;with call bell/phone within reach;with chair alarm set Nurse Communication: Mobility status;Other (comment) (EEG called and no longer registering; tried to find "gray cord" that they wanted plugged back in without success) PT Visit Diagnosis: Other abnormalities of gait and mobility (R26.89)    Time: 4098-1191 PT Time Calculation (min) (ACUTE ONLY): 22 min   Charges:   PT Evaluation $PT Eval Low Complexity: 1 Low           Jerolyn Center, PT  Acute Rehabilitation Services  Office [phone]   Rexanne Mano 03/22/2022, 10:04 AM

## 2022-03-22 NOTE — Progress Notes (Signed)
Neurology Progress Note  Interval History: No family at bedside.  Pt doing well today.  She does not feel like there have been any new episodes today, but no one at bedside to confirm.  She was able to follow all of my commands, fully oriented, doing well.  She notes she didn't sleep much last night, but still fully oriented to time and situation.  Had some trouble with word finding when chatting with her.      Objective: Vitals:   03/21/22 2353 03/22/22 0356  BP: (!) 146/63 108/74  Pulse: 73 73  Resp: 18 16  Temp: 98.2 F (36.8 C) 98.2 F (36.8 C)  SpO2: 98% 97%   Physical Exam  Constitutional: Appears well-developed and well-nourished.   Cardiovascular: Normal rate and regular rhythm.  Respiratory: Effort normal, non-labored breathing  Neuro:  Mental Status: Patient is awake, alert, oriented to person, place, year, and situation. Patient is able to give a clear and coherent history. No signs of aphasia or neglect    Cranial Nerves: II: Visual Fields are full. Pupils are equal, round, and reactive to light.   III,IV, VI: EOMI without ptosis or diploplia.  V: Facial sensation is symmetric to temperature VII: Facial movement is symmetric resting and smiling VIII: Hearing is intact to voice X: Palate elevates symmetrically XI: Shoulder shrug is symmetric. XII: Tongue protrudes midline without atrophy or fasciculations.  Motor: Tone is normal. Bulk is normal. 5/5 strength was present in all four extremities.  Sensory: Sensation is symmetric to light touch and temperature in the arms and legs. No extinction to DSS present.  Deep Tendon Reflexes: 2+ and symmetric in the biceps and patellae.  Plantars: Toes are downgoing bilaterally.  Cerebellar: FNF and HKS are intact bilaterally ____________________________________________________  Pertinent Labs: Improving known kidney disease Creat 1.06, BUN 17, GFR 55  ->  Creat 0.75, BUN 12, GFR >60 Ethyl <10 A1C 5.9 Lipids -  normal  CT Head w/o 03/18/22: no acute abnormality unremarkable, chronic atrophy and vascular disease  3. 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 4. Remote encephalomalacia of the right temporal tip. 5. Aspects is 10/10.  CTA Head/Neck with contrast 03/19/22: LVO (-) 1. Negative for large vessel occlusion. Negative CT Perfusion. Negative CT Venogram. 2. 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 - moderate stenosis Left PCA P1 segment stenosis. - bilateral carotid bifurcation plaque with up to 50% proximal ICA stenosis. 3. Positive also for a small 3 mm outpouching at the distal Right ICA. But favor incidental vessel infundibulum rather than unruptured saccular aneurysm.  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 Advanced cerebral atrophy for age, with ex vacuo dilatation of the lateral and third ventricles. Mega cisterna magna.  ECHO 03/19/22: LV EF 55-60%  rEEG 03/19/22: Slowing over left hemisphere-no seizures.   vEEG 03/21/22: unremarkable, no epileptiform activity, awaiting last nights report  _____________________________________________________________  Assessment and Plan: 76yo woman with hx DM2 and HTN admitted 11/22 for aphasia that resolved prior to arrival that was felt to be TIA. MRI brain was neg for acute infarct. The following day she underwent stroke code for acute onset aphasia with unremarkable CT/CTA f/b  repeat MRI brain (this time with and without contrast) that was again negative for acute infarct or other significant abnl. 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 rEEG showed L hemispheric slowing without epileptiform abnl. 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 She has no known seizure hx.  Overall exam is much improved since admit.  Wonder if she was having frequent seizures that were treated with her increased dose of Keppra.  Acute CVA ruled out: multiple MRIs negative for stroke, I think cerebrovascular disease is less likely etiology for her acute presentation LTM EEG unremarkable so far but does not rule out seizures Brain imaging shows mod-severe ex vacuo ventricular dilatation and mildly asymmetric hippocampus.  Possible dementia starting to present with word finding difficulties and wax/wane cognition in addition to possible seizures as these can often present together.  Waxing/waning aphasia Recommendations:  - continue PO keppra 1000mg  q 12 hrs - stopped vEEG - Seizure precautions - Discontinue Plavix since stroke workup (-), but continue ASA considering the significant intracranial disease  ___________________________________________________________  Patient seen and examined by NP with MD. MD to update note as needed.   , AGNP Neurology Hospitalist [phone] cell  To contact Stroke Continuity provider, please refer to [phone]. After hours, contact General Neurology.If 7pm- 7am, please page neurology on call as listed in AMION.  I have seen the patient reviewed the above note.  She continues to make progress with her speech much better than it was yesterday.  My suspicion is that she was having frequent seizures, now is gradually improving with Keppra treatment.  With negative 48 hours of EEG, and marked improvement, I do not think we have any other inpatient diagnostics or therapy to offer and therefore I think it be reasonable for her to follow-up with outpatient  neurology.  Neurology will be available on an as-needed basis moving forward.  [email or web address], MD [facility] [phone]  If 7pm- 7am, please page neurology on call as listed in AMION.

## 2022-03-22 NOTE — Procedures (Signed)
Patient Name: Krista Anderson  MRN: [id number]  Epilepsy Attending: Charlsie Quest  Referring Physician/Provider: Erick Blinks, MD  Duration: 03/22/2022 0053 to 03/22/2022 0730   Patient history: 76 y.o. female with a history of aphasia who is undergoing an EEG to evaluate for seizure    Level of alertness: Awake, asleep   AEDs during EEG study: LEV   Technical aspects: This EEG study was done with scalp electrodes positioned according to the 10-20 International system of electrode placement. Electrical activity was reviewed with band pass filter of 1-70Hz , sensitivity of 7 uV/mm, display speed of 22mm/sec with a 60Hz  notched filter applied as appropriate. EEG data were recorded continuously and digitally stored.  Video monitoring was available and reviewed as appropriate.   Description: The posterior dominant rhythm consists of 8 Hz activity of moderate voltage (25-35 uV) seen predominantly in posterior head regions, asymmetric ( left<right) and reactive to eye opening and eye closing. Sleep was characterized by vertex waves, sleep spindles (12 to 14 Hz), maximal frontocentral region. EEG showed continuous 3 to 6 Hz theta-delta slowing in left hemisphere. Hyperventilation and photic stimulation were not performed.      ABNORMALITY - Continuous slow, left hemisphere - Background asymmetry, left<right   IMPRESSION: This study is suggestive of cortical dysfunction arising from left hemisphere likely secondary to underlying structural abnormality. No seizures or epileptiform discharges were seen throughout the recording.   Abednego Yeates 

## 2022-03-23 LAB — GLUCOSE, CAPILLARY: Glucose-Capillary: 94 mg/dL (ref 70–99)

## 2022-04-07 ENCOUNTER — Encounter: Payer: Self-pay | Admitting: Emergency Medicine

## 2022-04-07 ENCOUNTER — Emergency Department
Admission: EM | Admit: 2022-04-07 | Discharge: 2022-04-07 | Disposition: A | Discharge status: Home / Self Care | Payer: Medicare Other | Attending: Emergency Medicine | Admitting: Emergency Medicine

## 2022-04-07 ENCOUNTER — Emergency Department: Payer: Medicare Other

## 2022-04-07 ENCOUNTER — Other Ambulatory Visit: Payer: Self-pay

## 2022-04-07 DIAGNOSIS — E119 Type 2 diabetes mellitus without complications: Secondary | ICD-10-CM | POA: Insufficient documentation

## 2022-04-07 DIAGNOSIS — R4182 Altered mental status, unspecified: Secondary | ICD-10-CM | POA: Diagnosis present

## 2022-04-07 DIAGNOSIS — R531 Weakness: Secondary | ICD-10-CM | POA: Insufficient documentation

## 2022-04-07 DIAGNOSIS — I10 Essential (primary) hypertension: Secondary | ICD-10-CM | POA: Diagnosis not present

## 2022-04-07 DIAGNOSIS — R791 Abnormal coagulation profile: Secondary | ICD-10-CM | POA: Diagnosis not present

## 2022-04-07 DIAGNOSIS — R270 Ataxia, unspecified: Secondary | ICD-10-CM | POA: Diagnosis not present

## 2022-04-07 LAB — CBC
HCT: 43 % (ref 36.0–46.0)
Hemoglobin: 13.9 g/dL (ref 12.0–15.0)
MCH: 30.2 pg (ref 26.0–34.0)
MCHC: 32.3 g/dL (ref 30.0–36.0)
MCV: 93.3 fL (ref 80.0–100.0)
Platelets: 214 10*3/uL (ref 150–400)
RBC: 4.61 MIL/uL (ref 3.87–5.11)
RDW: 13.9 % (ref 11.5–15.5)
WBC: 7.1 10*3/uL (ref 4.0–10.5)
nRBC: 0 % (ref 0.0–0.2)

## 2022-04-07 LAB — DIFFERENTIAL
Abs Immature Granulocytes: 0.02 10*3/uL (ref 0.00–0.07)
Basophils Absolute: 0.1 10*3/uL (ref 0.0–0.1)
Basophils Relative: 1 %
Eosinophils Absolute: 0.2 10*3/uL (ref 0.0–0.5)
Eosinophils Relative: 3 %
Immature Granulocytes: 0 %
Lymphocytes Relative: 28 %
Lymphs Abs: 2 10*3/uL (ref 0.7–4.0)
Monocytes Absolute: 0.4 10*3/uL (ref 0.1–1.0)
Monocytes Relative: 6 %
Neutro Abs: 4.4 10*3/uL (ref 1.7–7.7)
Neutrophils Relative %: 62 %

## 2022-04-07 LAB — COMPREHENSIVE METABOLIC PANEL
ALT: 14 U/L (ref 0–44)
AST: 18 U/L (ref 15–41)
Albumin: 3.9 g/dL (ref 3.5–5.0)
Alkaline Phosphatase: 64 U/L (ref 38–126)
Anion gap: 7 (ref 5–15)
BUN: 15 mg/dL (ref 8–23)
CO2: 23 mmol/L (ref 22–32)
Calcium: 9.3 mg/dL (ref 8.9–10.3)
Chloride: 110 mmol/L (ref 98–111)
Creatinine, Ser: 0.94 mg/dL (ref 0.44–1.00)
GFR, Estimated: >60 mL/min (ref >60)
Glucose, Bld: 144 mg/dL — ABNORMAL HIGH (ref 70–99)
Potassium: 3.8 mmol/L (ref 3.5–5.1)
Sodium: 140 mmol/L (ref 135–145)
Total Bilirubin: 0.8 mg/dL (ref 0.3–1.2)
Total Protein: 7.3 g/dL (ref 6.5–8.1)

## 2022-04-07 LAB — PROTIME-INR
INR: 1 (ref 0.8–1.2)
Prothrombin Time: 13.6 seconds (ref 11.4–15.2)

## 2022-04-07 LAB — APTT: aPTT: 28 seconds (ref 24–36)

## 2022-04-07 MED ORDER — SODIUM CHLORIDE 0.9% FLUSH: 3.0000 mL | Freq: Once | INTRAVENOUS | Status: DC

## 2022-04-07 NOTE — ED Provider Triage Note (Signed)
  Emergency Medicine Provider Triage Evaluation Note  Krista Anderson , a 76 y.o.female,  was evaluated in triage.  Pt complains of increased weakness.  She states that she has been leaning to the right more because her left leg feels heavy and weak.  Endorses some intermittent blurred vision as well.  She was recently admitted for strokelike symptoms but is unsure what caused them.  Not having any acute neurological deficits at this time.   Review of Systems  Positive: Left-sided weakness, blurred vision Negative: Denies fever, chest pain, vomiting  Physical Exam   Vitals:   04/07/22 1118  BP: 137/86  Pulse: 66  Resp: 16  Temp: (!) 97.5 F (36.4 C)  SpO2: 95%   Gen:   Awake, no distress   Resp:  Normal effort  MSK:   Moves extremities without difficulty  Other:    Medical Decision Making  Given the patient's initial medical screening exam, the following diagnostic evaluation has been ordered. The patient will be placed in the appropriate treatment space, once one is available, to complete the evaluation and treatment. I have discussed the plan of care with the patient and I have advised the patient that an ED physician or mid-level practitioner will reevaluate their condition after the test results have been received, as the results may give them additional insight into the type of treatment they may need.    Diagnostics: Labs, EKG, UA, head CT  Treatments: none immediately   Varney Daily, [place] 04/07/22 1221

## 2022-04-07 NOTE — ED Provider Notes (Signed)
[facility] Provider Note    Event Date/Time   First MD Initiated Contact with Patient 04/07/22 1348     (approximate)  History   Chief Complaint: Weakness  HPI  ADAIR LEMAR is a 76 y.o. female with a past medical history of hypertension, diabetes, presents emergency department for continued altered mental status.  According to the family 3 weeks ago the patient was admitted to the hospital for what they thought was a stroke.  Ultimately ruled out for stroke after negative MRI, did have an abnormal EEG per my record review of the patient's discharge summary and thought to be possibly due to her seizures patient was continued on Keppra patient has been following up with the neurologist with whom she was already following.  Patient had a neurology appointment this past week but no new recommendations.  Had a 72-hour EEG planned but is not till February.  They state over the past week or so the patient has been leaning more towards her left and seeming more off balance than typical.  They also state this morning she appeared to have trouble moving her right lower extremity but states that is since resolved.  Patient is awake alert she is oriented.  Patient states she feels fatigued and just feels more weak generally.  Denies any fever no vomiting no diarrhea.  No chest pain no abdominal pain.  Patient is leaning to the left initially but is able to sit up and able to perform a complex neurological exam.  Physical Exam   Triage Vital Signs: ED Triage Vitals  Enc Vitals Group     BP 04/07/22 1118 137/86     Pulse Rate 04/07/22 1118 66     Resp 04/07/22 1118 16     Temp 04/07/22 1118 (!) 97.5 F (36.4 C)     Temp Source 04/07/22 1118 Oral     SpO2 04/07/22 1118 95 %     Weight 04/07/22 1118 195 lb (88.5 kg)     Height 04/07/22 1118 5\' 5"  (1.651 m)     Head Circumference --      Peak Flow --      Pain Score 04/07/22 1116 0     Pain Loc --      Pain Edu? --       Excl. in GC? --     Most recent vital signs: Vitals:   04/07/22 1400 04/07/22 1430  BP: (!) 149/62 (!) 163/65  Pulse: (!) 57 60  Resp: 17 16  Temp:    SpO2: 96% 96%    General: Awake, no distress.  Initially somewhat leaning to the left but is able to sit up straight. CV:  Good peripheral perfusion.  Regular rate and rhythm  Resp:  Normal effort.  Equal breath sounds bilaterally.  Abd:  No distention.  Soft, nontender.  No rebound or guarding. Other:  Cranial nerves intact with no obvious facial droop or though the family continues state that they feel that the right eye is drooping somewhat compared to the left.  This is possible at times but overall I do not appreciate a droop.  Patient's grip strengths are equal bilaterally with 5/5 motor in bilateral upper and lower extremities.  No upper or lower extremity drift on my examination.   ED Results / Procedures / Treatments   EKG  EKG viewed and interpreted by myself shows normal sinus rhythm at 70 bpm with a narrow QRS, normal axis, normal intervals, no  concerning ST changes.  RADIOLOGY  I have reviewed the patient's CT images.  Patient appears to have large ventricles on the CT but no bleed. Radiology is read the CT scan is negative for acute process.   MEDICATIONS ORDERED IN ED: Medications  sodium chloride flush (NS) 0.9 % injection 3 mL (0 mLs Intravenous Hold 04/07/22 1506)     IMPRESSION / MDM / ASSESSMENT AND PLAN / ED COURSE  I reviewed the triage vital signs and the nursing notes.  Patient's presentation is most consistent with acute presentation with potential threat to life or bodily function.  Patient presents emergency department for continued ataxia and weakness.  Patient continues to take her Keppra no obvious seizure activity has a 72-hour EEG scheduled in February.  Patient follows up with Dr. Malvin Johns of neurology.  Patient's workup in the emergency department is thus far reassuring occluding normal  physical exam and reassuring neurological exam.  Patient's CBC is normal, chemistry is normal, CT scan of the head does not appear to show any significant abnormality, however the patient does appear to have large ventricles which could possibly raise the possibility of normal pressure hydrocephalus as a possible explanation for her ataxia.  I reviewed the patient's recent discharge summary at that time patient had a negative MRI and then a negative MRI with contrast.  However as they state the patient symptoms have worsened over the last 1 week.  We will repeat an MRI given name otherwise normal workup.  If the MRI is negative I believe the patient could be discharged home and follow-up with her neurologist with whom she is already seeing.  MRI pending, patient care signed out to oncoming provider.  FINAL CLINICAL IMPRESSION(S) / ED DIAGNOSES   Ataxia    Note:  This document was prepared using Dragon voice recognition software and may include unintentional dictation errors.   Minna Antis, MD 04/07/22 1517

## 2022-04-07 NOTE — ED Triage Notes (Signed)
Pt comes with c/o increased weakness. Pt states she has been leaning to the right. Pt states right sided facial droop also. Pt stats her left leg feels heavy. Pt states little somewhat blurry vision. Pt denies any slurirng speech.  Pt states she was here recently and admitted for stroke like symptoms but was told it was seizure. Pt denies any hx of this. Pt states she hasn't felt right since being discharged.   Pt speaking in complete sentences at this time.

## 2022-04-07 NOTE — Discharge Instructions (Signed)
You were seen in the emergency department today for weakness.  Your lab work was normal.  You had an MRI done of your brain that did not show any new findings of a stroke.  It is importantly continue to follow-up with your neurologist.  Return to the emergency department for any worsening symptoms.

## 2022-04-07 NOTE — ED Provider Notes (Signed)
Care assumed of patient from outgoing provider.  See their note for initial history, exam and plan.  Clinical Course as of 04/07/22 1612  Tue Apr 07, 2022  1548 Recent admit in Nov for work up w/ questionable seizure diagnosis and started on keppra. Last week leaning towards L and R leg issues. MRI neg plans to follow up as an outpatient.  [SM]    Clinical Course User Index [SM] Corena Herter, MD   MRI without acute infarct.  Ventricles unchanged from prior.  Small vessel disease.  Chronic small cyst that has been unchanged.   Corena Herter, MD 04/07/22 [id number]

## 2022-08-19 ENCOUNTER — Other Ambulatory Visit: Payer: Self-pay | Admitting: Physician Assistant

## 2022-08-19 DIAGNOSIS — I671 Cerebral aneurysm, nonruptured: Secondary | ICD-10-CM

## 2022-09-16 ENCOUNTER — Other Ambulatory Visit (INDEPENDENT_AMBULATORY_CARE_PROVIDER_SITE_OTHER): Payer: Self-pay | Admitting: Family Medicine

## 2022-09-16 DIAGNOSIS — M79604 Pain in right leg: Secondary | ICD-10-CM

## 2022-09-18 ENCOUNTER — Ambulatory Visit
Admission: RE | Admit: 2022-09-18 | Discharge: 2022-09-18 | Disposition: A | Discharge status: Home / Self Care | Payer: Medicare HMO | Source: Ambulatory Visit | Attending: Physician Assistant | Admitting: Physician Assistant

## 2022-09-18 DIAGNOSIS — I671 Cerebral aneurysm, nonruptured: Secondary | ICD-10-CM

## 2022-09-25 ENCOUNTER — Ambulatory Visit (INDEPENDENT_AMBULATORY_CARE_PROVIDER_SITE_OTHER): Payer: Medicare HMO

## 2022-09-25 DIAGNOSIS — M79605 Pain in left leg: Secondary | ICD-10-CM

## 2022-09-25 DIAGNOSIS — M79604 Pain in right leg: Secondary | ICD-10-CM | POA: Diagnosis not present

## 2023-07-14 ENCOUNTER — Encounter: Payer: Self-pay | Admitting: Ophthalmology

## 2023-07-27 ENCOUNTER — Encounter: Payer: Self-pay | Admitting: Ophthalmology

## 2023-07-27 NOTE — Anesthesia Preprocedure Evaluation (Signed)
 Anesthesia Evaluation  Patient identified by MRN, date of birth, ID band Patient awake    Reviewed: Allergy & Precautions, H&P , NPO status , Patient's Chart, lab work & pertinent test results  Airway Mallampati: III  TM Distance: >3 FB Neck ROM: Full    Dental no notable dental hx. (+) Missing   Pulmonary neg pulmonary ROS   Pulmonary exam normal breath sounds clear to auscultation       Cardiovascular hypertension, Normal cardiovascular exam Rhythm:Regular Rate:Normal     Neuro/Psych Seizures -,  TIACVA negative neurological ROS  negative psych ROS   GI/Hepatic negative GI ROS, Neg liver ROS,,,  Endo/Other  negative endocrine ROSdiabetes    Renal/GU negative Renal ROS  negative genitourinary   Musculoskeletal negative musculoskeletal ROS (+) Arthritis ,    Abdominal   Peds negative pediatric ROS (+)  Hematology negative hematology ROS (+)   Anesthesia Other Findings Borderline diabetes Hypertension Pre-diabetes Arthritis Seizure---no recent seizure activity 09/18/2022 MR ANGIO HEAD WO CONTRAST IMPRESSION:  1. No large vessel occlusion or proximal hemodynamically significant  stenosis.  2. Similar 3 mm outpouching arising from the distal right ICA with  small vessel arising from the tip, compatible with an infundibulum.     Reproductive/Obstetrics negative OB ROS                              Anesthesia Physical Anesthesia Plan  ASA: 3  Anesthesia Plan: MAC   Post-op Pain Management:    Induction: Intravenous  PONV Risk Score and Plan:   Airway Management Planned: Natural Airway and Nasal Cannula  Additional Equipment:   Intra-op Plan:   Post-operative Plan:   Informed Consent: I have reviewed the patients History and Physical, chart, labs and discussed the procedure including the risks, benefits and alternatives for the proposed anesthesia with the patient or  authorized representative who has indicated his/her understanding and acceptance.     Dental Advisory Given  Plan Discussed with: Anesthesiologist, CRNA and Surgeon  Anesthesia Plan Comments: (Patient consented for risks of anesthesia including but not limited to:  - adverse reactions to medications - damage to eyes, teeth, lips or other oral mucosa - nerve damage due to positioning  - sore throat or hoarseness - Damage to heart, brain, nerves, lungs, other parts of body or loss of life  Patient voiced understanding and assent.)        Anesthesia Quick Evaluation

## 2023-07-27 NOTE — Discharge Instructions (Signed)

## 2023-07-29 ENCOUNTER — Encounter: Payer: Self-pay | Admitting: Ophthalmology

## 2023-07-29 ENCOUNTER — Ambulatory Visit: Payer: Self-pay | Admitting: Anesthesiology

## 2023-07-29 ENCOUNTER — Other Ambulatory Visit: Payer: Self-pay

## 2023-07-29 ENCOUNTER — Ambulatory Visit
Admission: RE | Admit: 2023-07-29 | Discharge: 2023-07-29 | Disposition: A | Discharge status: Home / Self Care | Attending: Ophthalmology | Admitting: Ophthalmology

## 2023-07-29 ENCOUNTER — Encounter
Admission: RE | Disposition: A | Discharge status: Home / Self Care | Payer: Self-pay | Source: Home / Self Care | Attending: Ophthalmology

## 2023-07-29 DIAGNOSIS — I10 Essential (primary) hypertension: Secondary | ICD-10-CM | POA: Insufficient documentation

## 2023-07-29 DIAGNOSIS — H2512 Age-related nuclear cataract, left eye: Secondary | ICD-10-CM | POA: Diagnosis present

## 2023-07-29 HISTORY — DX: Prediabetes: R73.03

## 2023-07-29 HISTORY — DX: Unspecified osteoarthritis, unspecified site: M19.90

## 2023-07-29 HISTORY — DX: Cerebral aneurysm, nonruptured: I67.1

## 2023-07-29 SURGERY — PHACOEMULSIFICATION, CATARACT, WITH IOL INSERTION
Anesthesia: Monitor Anesthesia Care | Site: Eye | Laterality: Left

## 2023-07-29 MED ORDER — BRIMONIDINE TARTRATE-TIMOLOL 0.2-0.5 % OP SOLN
OPHTHALMIC | Status: DC | PRN
  Administered 2023-07-29: 1 [drp] via OPHTHALMIC

## 2023-07-29 MED ORDER — SODIUM CHLORIDE 0.9% FLUSH
INTRAVENOUS | Status: DC | PRN
  Administered 2023-07-29: 10 mL via INTRAVENOUS

## 2023-07-29 MED ORDER — FENTANYL CITRATE (PF) 100 MCG/2ML IJ SOLN
INTRAMUSCULAR | Status: DC | PRN
  Administered 2023-07-29: 50 ug via INTRAVENOUS

## 2023-07-29 MED ORDER — LIDOCAINE HCL (PF) 2 % IJ SOLN
INTRAOCULAR | Status: DC | PRN
  Administered 2023-07-29: 4 mL via INTRAOCULAR

## 2023-07-29 MED ORDER — SIGHTPATH DOSE#1 BSS IO SOLN
INTRAOCULAR | Status: DC | PRN
  Administered 2023-07-29: 80 mL via OPHTHALMIC

## 2023-07-29 MED ORDER — MIDAZOLAM HCL 2 MG/2ML IJ SOLN
INTRAMUSCULAR | Status: AC
  Filled 2023-07-29: qty 2

## 2023-07-29 MED ORDER — MOXIFLOXACIN HCL 0.5 % OP SOLN
OPHTHALMIC | Status: DC | PRN
  Administered 2023-07-29: .2 mL via OPHTHALMIC

## 2023-07-29 MED ORDER — TETRACAINE HCL 0.5 % OP SOLN
1.0000 [drp] | OPHTHALMIC | Status: DC | PRN
  Administered 2023-07-29 (×3): 1 [drp] via OPHTHALMIC

## 2023-07-29 MED ORDER — ARMC OPHTHALMIC DILATING DROPS
OPHTHALMIC | Status: AC
  Filled 2023-07-29: qty 0.5

## 2023-07-29 MED ORDER — MIDAZOLAM HCL 2 MG/2ML IJ SOLN
INTRAMUSCULAR | Status: DC | PRN
  Administered 2023-07-29: 1.5 mg via INTRAVENOUS

## 2023-07-29 MED ORDER — SIGHTPATH DOSE#1 NA HYALUR & NA CHOND-NA HYALUR IO KIT
PACK | INTRAOCULAR | Status: DC | PRN
  Administered 2023-07-29: 1 via OPHTHALMIC

## 2023-07-29 MED ORDER — ARMC OPHTHALMIC DILATING DROPS
1.0000 | OPHTHALMIC | Status: DC | PRN
  Administered 2023-07-29 (×3): 1 via OPHTHALMIC

## 2023-07-29 MED ORDER — FENTANYL CITRATE (PF) 100 MCG/2ML IJ SOLN
INTRAMUSCULAR | Status: AC
  Filled 2023-07-29: qty 2

## 2023-07-29 MED ORDER — TETRACAINE HCL 0.5 % OP SOLN
OPHTHALMIC | Status: AC
  Filled 2023-07-29: qty 4

## 2023-07-29 MED ORDER — SIGHTPATH DOSE#1 BSS IO SOLN
INTRAOCULAR | Status: DC | PRN
  Administered 2023-07-29: 15 mL via INTRAOCULAR

## 2023-07-29 SURGICAL SUPPLY — 13 items
CATARACT SUITE SIGHTPATH (MISCELLANEOUS) ×1 IMPLANT
DISSECTOR HYDRO NUCLEUS 50X22 (MISCELLANEOUS) ×1 IMPLANT
DRSG TEGADERM 2-3/8X2-3/4 SM (GAUZE/BANDAGES/DRESSINGS) ×1 IMPLANT
FEE CATARACT SUITE SIGHTPATH (MISCELLANEOUS) ×1 IMPLANT
GLOVE BIOGEL PI IND STRL 8 (GLOVE) ×1 IMPLANT
GLOVE SURG LX STRL 7.5 STRW (GLOVE) ×1 IMPLANT
GLOVE SURG PROTEXIS BL SZ6.5 (GLOVE) ×1 IMPLANT
GLOVE SURG SYN 6.5 PF PI BL (GLOVE) ×1 IMPLANT
LENS IOL ENVISTA 20.5 ×1 IMPLANT
LENS IOL ENVISTA UV+ 20.5 IMPLANT
NDL FILTER BLUNT 18X1 1/2 (NEEDLE) ×1 IMPLANT
NEEDLE FILTER BLUNT 18X1 1/2 (NEEDLE) ×1 IMPLANT
SYR 3ML LL SCALE MARK (SYRINGE) ×1 IMPLANT

## 2023-07-29 NOTE — Op Note (Signed)
 OPERATIVE NOTE  TAWANIA DAPONTE [id number] 07/29/2023   PREOPERATIVE DIAGNOSIS: Nuclear sclerotic cataract left eye. H25.12   POSTOPERATIVE DIAGNOSIS: Nuclear sclerotic cataract left eye. H25.12   PROCEDURE:  Phacoemusification with posterior chamber intraocular lens placement of the left eye  Ultrasound time: Procedure(s): PHACOEMULSIFICATION, CATARACT, WITH IOL INSERTION 13.65 01:17.7 (Left)  LENS:   Implant Name Type Inv. Item Serial No. Manufacturer Lot No. LRB No. Used Action  enVista Hydrophobic Acrylic IOL 20.50D   [id number] [facility] [id number] Left 1 Implanted      SURGEON:  Julious Payer. Rolley Sims, MD   ANESTHESIA:  Topical with tetracaine drops, augmented with 1% preservative-free intracameral lidocaine.   COMPLICATIONS:  None.   DESCRIPTION OF PROCEDURE:  The patient was identified in the holding room and transported to the operating room and placed in the supine position under the operating microscope.  The left eye was identified as the operative eye, which was prepped and draped in the usual sterile ophthalmic fashion.   A 1 millimeter clear-corneal paracentesis was made inferotemporally. Preservative-free 1% lidocaine mixed with 1:1,000 bisulfite-free aqueous solution of epinephrine was injected into the anterior chamber. The anterior chamber was then filled with Viscoat viscoelastic. A 2.4 millimeter keratome was used to make a clear-corneal incision superotemporally. A curvilinear capsulorrhexis was made with a cystotome and capsulorrhexis forceps. Balanced salt solution was used to hydrodissect and hydrodelineate the nucleus. Phacoemulsification was then used to remove the lens nucleus and epinucleus. The remaining cortex was then removed using the irrigation and aspiration handpiece. Provisc was then placed into the capsular bag to distend it for lens placement. An +20.50 D [id number] intraocular lens was then injected into the capsular bag. The remaining viscoelastic was aspirated.    Wounds were hydrated with balanced salt solution.  The anterior chamber was inflated to a physiologic pressure with balanced salt solution.  No wound leaks were noted. Moxifloxacin was injected intracamerally.  Timolol and Brimonidine drops were applied to the eye.  The patient was taken to the recovery room in stable condition without complications of anesthesia or surgery.  Rolly Pancake [place] 07/29/2023, 9:24 AM

## 2023-07-29 NOTE — Anesthesia Postprocedure Evaluation (Signed)
 Anesthesia Post Note  Patient: Krista Anderson  Procedure(s) Performed: PHACOEMULSIFICATION, CATARACT, WITH IOL INSERTION 13.65 01:17.7 (Left: Eye)  Patient location during evaluation: PACU Anesthesia Type: MAC Level of consciousness: awake and alert Pain management: pain level controlled Vital Signs Assessment: post-procedure vital signs reviewed and stable Respiratory status: spontaneous breathing, nonlabored ventilation, respiratory function stable and patient connected to nasal cannula oxygen Cardiovascular status: stable and blood pressure returned to baseline Postop Assessment: no apparent nausea or vomiting Anesthetic complications: no   No notable events documented.   Last Vitals:  Vitals:   07/29/23 0926 07/29/23 0931  BP: (!) 153/77 (!) 146/84  Pulse: 67   Resp: 18   Temp: (!) 36.4 C   SpO2: 95%     Last Pain:  Vitals:   07/29/23 0931  TempSrc:   PainSc: 0-No pain                 Marisue Humble

## 2023-07-29 NOTE — Transfer of Care (Signed)
 Immediate Anesthesia Transfer of Care Note  Patient: Krista Anderson  Procedure(s) Performed: PHACOEMULSIFICATION, CATARACT, WITH IOL INSERTION 13.65 01:17.7 (Left: Eye)  Patient Location: PACU  Anesthesia Type:MAC  Level of Consciousness: awake, alert , and oriented  Airway & Oxygen Therapy: Patient Spontanous Breathing  Post-op Assessment: Report given to RN and Post -op Vital signs reviewed and stable  Post vital signs: Reviewed and stable  Last Vitals: normal temp  Vitals Value Taken Time  BP 153/77 07/29/23 0926  Temp    Pulse 67 07/29/23 0927  Resp 23 07/29/23 0927  SpO2 95 % 07/29/23 0927  Vitals shown include unfiled device data.  Last Pain:  Vitals:   07/29/23 0803  TempSrc: Temporal  PainSc: 0-No pain         Complications: No notable events documented.

## 2023-07-29 NOTE — H&P (Signed)
 [facility]   Primary Care Physician:  [facility] Ophthalmologist: Dr. Deberah Pelton  Pre-Procedure History & Physical: HPI:  Krista Anderson is a 78 y.o. female here for cataract surgery.   Past Medical History:  Diagnosis Date   Arthritis    Borderline diabetes    Brain aneurysm    Hypertension    Pre-diabetes    Seizure (HCC) 2023    Past Surgical History:  Procedure Laterality Date   ABDOMINAL HYSTERECTOMY     APPENDECTOMY     BACK SURGERY     CARPAL TUNNEL RELEASE Bilateral    CHOLECYSTECTOMY     FRACTURE SURGERY     both wrists    Prior to Admission medications   Medication Sig Start Date End Date Taking? Authorizing Provider  acetaminophen (TYLENOL) 500 MG tablet Take 500 mg by mouth every 6 (six) hours as needed for mild pain.    Yes [provider]  allopurinol (ZYLOPRIM) 100 MG tablet Take 100 mg by mouth daily. 02/26/22  Yes [provider]  aspirin 81 MG chewable tablet Chew 81 mg by mouth daily. 01/16/09  Yes [provider]  lisinopril (PRINIVIL,ZESTRIL) 20 MG tablet Take 40 mg by mouth daily.   Yes [provider]  meloxicam (MOBIC) 15 MG tablet Take 15 mg by mouth daily. 02/25/22  Yes [provider]  pravastatin (PRAVACHOL) 80 MG tablet Take 80 mg by mouth at bedtime.   Yes [provider]  VITAMIN D-1000 MAX ST 25 MCG (1000 UT) tablet Take 1,000 Units by mouth daily. 12/05/21  Yes [provider]  VOLTAREN 1 % GEL Apply 1 application topically daily as needed for pain. 01/15/16  Yes [provider]  Colchicine 0.6 MG CAPS Take 0.6 mg by mouth daily as needed (Gout). Patient not taking: Reported on 07/14/2023 02/25/22   [provider]  cyanocobalamin (VITAMIN B12) 1000 MCG tablet Take 1,000 mcg by mouth daily. Patient not taking: Reported on 07/14/2023 07/30/21   [provider]  fluticasone (FLONASE) 50 MCG/ACT nasal spray Place 1 spray into both  nostrils 2 (two) times daily. 09/09/21   [provider]  gabapentin (NEURONTIN) 300 MG capsule Take 300 mg by mouth 3 (three) times daily. Patient not taking: Reported on 07/14/2023    [provider]  levETIRAcetam (KEPPRA) 1000 MG tablet Take 1 tablet (1,000 mg total) by mouth 2 (two) times daily. Patient not taking: Reported on 07/14/2023 03/22/22   Maretta Bees, MD  polyethylene glycol powder (GLYCOLAX/MIRALAX) powder Take 17 g by mouth daily as needed for constipation. Patient not taking: Reported on 07/14/2023 12/16/15   [provider]    Allergies as of 07/14/2023 - Review Complete 07/14/2023  Allergen Reaction Noted   Elavil [amitriptyline] Other (See Comments) 01/18/2016   Elemental sulfur Nausea Only 01/18/2016    Family History  Problem Relation Age of Onset   Breast cancer Neg Hx     Social History   Socioeconomic History   Marital status: Widowed    Spouse name: Not on file   Number of children: Not on file   Years of education: Not on file   Highest education level: Not on file  Occupational History   Not on file  Tobacco Use   Smoking status: Never   Smokeless tobacco: Never  Substance and Sexual Activity   Alcohol use: No   Drug use: Not Currently   Sexual activity: Not on file  Other  Topics Concern   Not on file  Social History Narrative   Not on file   Social Drivers of Health   Financial Resource Strain: Not on file  Food Insecurity: No Food Insecurity (03/20/2022)   Hunger Vital Sign    Worried About Running Out of Food in the Last Year: Never true    Ran Out of Food in the Last Year: Never true  Transportation Needs: No Transportation Needs (03/20/2022)   PRAPARE - Administrator, Civil Service (Medical): No    Lack of Transportation (Non-Medical): No  Physical Activity: Not on file  Stress: Not on file  Social Connections: Not on file  Intimate Partner Violence: Not At Risk (03/20/2022)    Humiliation, Afraid, Rape, and Kick questionnaire    Fear of Current or Ex-Partner: No    Emotionally Abused: No    Physically Abused: No    Sexually Abused: No    Review of Systems: See HPI, otherwise negative ROS  Physical Exam: Ht 5\' 4"  (1.626 m)   Wt 93 kg   BMI 35.19 kg/m  General:   Alert, cooperative in NAD Head:  Normocephalic and atraumatic. Respiratory:  Normal work of breathing. Cardiovascular:  RRR  Impression/Plan: [name] is here for cataract surgery.  Risks, benefits, limitations, and alternatives regarding cataract surgery have been reviewed with the patient.  Questions have been answered.  All parties agreeable.   Estanislado Pandy, MD  07/29/2023, 7:04 AM

## 2023-08-02 ENCOUNTER — Encounter: Payer: Self-pay | Admitting: Ophthalmology

## 2023-08-02 NOTE — Anesthesia Preprocedure Evaluation (Addendum)
 Anesthesia Evaluation  Patient identified by MRN, date of birth, ID band Patient awake    Reviewed: Allergy & Precautions, H&P , NPO status , Patient's Chart, lab work & pertinent test results  Airway Mallampati: III  TM Distance: >3 FB Neck ROM: Full    Dental no notable dental hx. (+) Missing   Pulmonary neg pulmonary ROS   Pulmonary exam normal breath sounds clear to auscultation       Cardiovascular hypertension, negative cardio ROS Normal cardiovascular exam Rhythm:Regular Rate:Normal     Neuro/Psych Seizures -,  09/18/2022 MR ANGIO HEAD WO CONTRAST IMPRESSION:  1. No large vessel occlusion or proximal hemodynamically significant  stenosis.  2. Similar 3 mm outpouching arising from the distal right ICA with  small vessel arising from the tip, compatible with an infundibulum.   TIACVA negative neurological ROS  negative psych ROS   GI/Hepatic negative GI ROS, Neg liver ROS,,,  Endo/Other  negative endocrine ROSdiabetes    Renal/GU negative Renal ROS  negative genitourinary   Musculoskeletal negative musculoskeletal ROS (+) Arthritis ,    Abdominal   Peds negative pediatric ROS (+)  Hematology negative hematology ROS (+)   Anesthesia Other Findings 07-29-23 previous cataract surgery Dr. Aldo Amble anesthesiologist  Borderline diabetes  Hypertension Pre-diabetes  Arthritis Seizure   Brain aneurysm    Reproductive/Obstetrics negative OB ROS                             Anesthesia Physical Anesthesia Plan  ASA: 3  Anesthesia Plan: MAC   Post-op Pain Management:    Induction: Intravenous  PONV Risk Score and Plan:   Airway Management Planned: Natural Airway and Nasal Cannula  Additional Equipment:   Intra-op Plan:   Post-operative Plan:   Informed Consent: I have reviewed the patients History and Physical, chart, labs and discussed the procedure including the risks,  benefits and alternatives for the proposed anesthesia with the patient or authorized representative who has indicated his/her understanding and acceptance.     Dental Advisory Given  Plan Discussed with: Anesthesiologist, CRNA and Surgeon  Anesthesia Plan Comments: (Patient consented for risks of anesthesia including but not limited to:  - adverse reactions to medications - damage to eyes, teeth, lips or other oral mucosa - nerve damage due to positioning  - sore throat or hoarseness - Damage to heart, brain, nerves, lungs, other parts of body or loss of life  Patient voiced understanding and assent.)        Anesthesia Quick Evaluation

## 2023-08-11 NOTE — Discharge Instructions (Signed)

## 2023-08-12 ENCOUNTER — Encounter
Admission: RE | Disposition: A | Discharge status: Home / Self Care | Payer: Self-pay | Source: Home / Self Care | Attending: Ophthalmology

## 2023-08-12 ENCOUNTER — Ambulatory Visit: Payer: Self-pay | Admitting: Anesthesiology

## 2023-08-12 ENCOUNTER — Ambulatory Visit
Admission: RE | Admit: 2023-08-12 | Discharge: 2023-08-12 | Disposition: A | Discharge status: Home / Self Care | Attending: Ophthalmology | Admitting: Ophthalmology

## 2023-08-12 ENCOUNTER — Encounter: Payer: Self-pay | Admitting: Ophthalmology

## 2023-08-12 ENCOUNTER — Other Ambulatory Visit: Payer: Self-pay

## 2023-08-12 DIAGNOSIS — E1136 Type 2 diabetes mellitus with diabetic cataract: Secondary | ICD-10-CM | POA: Insufficient documentation

## 2023-08-12 DIAGNOSIS — I10 Essential (primary) hypertension: Secondary | ICD-10-CM | POA: Insufficient documentation

## 2023-08-12 DIAGNOSIS — H2511 Age-related nuclear cataract, right eye: Secondary | ICD-10-CM | POA: Insufficient documentation

## 2023-08-12 SURGERY — PHACOEMULSIFICATION, CATARACT, WITH IOL INSERTION
Anesthesia: Monitor Anesthesia Care | Site: Eye | Laterality: Right

## 2023-08-12 MED ORDER — SIGHTPATH DOSE#1 NA HYALUR & NA CHOND-NA HYALUR IO KIT
PACK | INTRAOCULAR | Status: DC | PRN
  Administered 2023-08-12: 1 via OPHTHALMIC

## 2023-08-12 MED ORDER — MIDAZOLAM HCL 2 MG/2ML IJ SOLN
INTRAMUSCULAR | Status: AC
  Filled 2023-08-12: qty 2

## 2023-08-12 MED ORDER — FENTANYL CITRATE (PF) 100 MCG/2ML IJ SOLN
INTRAMUSCULAR | Status: AC
Start: 2023-08-12 — End: ?
  Filled 2023-08-12: qty 2

## 2023-08-12 MED ORDER — TETRACAINE HCL 0.5 % OP SOLN
1.0000 [drp] | OPHTHALMIC | Status: DC | PRN
  Administered 2023-08-12 (×3): 1 [drp] via OPHTHALMIC

## 2023-08-12 MED ORDER — MIDAZOLAM HCL 2 MG/2ML IJ SOLN
INTRAMUSCULAR | Status: DC | PRN
Start: 2023-08-12 — End: 2023-08-12
  Administered 2023-08-12: 2 mg via INTRAVENOUS

## 2023-08-12 MED ORDER — TETRACAINE HCL 0.5 % OP SOLN
OPHTHALMIC | Status: AC
  Filled 2023-08-12: qty 4

## 2023-08-12 MED ORDER — MOXIFLOXACIN HCL 0.5 % OP SOLN
OPHTHALMIC | Status: DC | PRN
  Administered 2023-08-12: .2 mL via OPHTHALMIC

## 2023-08-12 MED ORDER — LIDOCAINE HCL (PF) 2 % IJ SOLN
INTRAOCULAR | Status: DC | PRN
  Administered 2023-08-12: 4 mL via INTRAOCULAR

## 2023-08-12 MED ORDER — ARMC OPHTHALMIC DILATING DROPS
OPHTHALMIC | Status: AC
  Filled 2023-08-12: qty 0.5

## 2023-08-12 MED ORDER — FENTANYL CITRATE (PF) 100 MCG/2ML IJ SOLN
INTRAMUSCULAR | Status: DC | PRN
  Administered 2023-08-12: 25 ug via INTRAVENOUS

## 2023-08-12 MED ORDER — BRIMONIDINE TARTRATE-TIMOLOL 0.2-0.5 % OP SOLN
OPHTHALMIC | Status: DC | PRN
  Administered 2023-08-12: 1 [drp] via OPHTHALMIC

## 2023-08-12 MED ORDER — SIGHTPATH DOSE#1 BSS IO SOLN
INTRAOCULAR | Status: DC | PRN
  Administered 2023-08-12: 70 mL via OPHTHALMIC

## 2023-08-12 MED ORDER — ARMC OPHTHALMIC DILATING DROPS
1.0000 | OPHTHALMIC | Status: DC | PRN
  Administered 2023-08-12 (×3): 1 via OPHTHALMIC

## 2023-08-12 SURGICAL SUPPLY — 13 items
CATARACT SUITE SIGHTPATH (MISCELLANEOUS) ×1 IMPLANT
DISSECTOR HYDRO NUCLEUS 50X22 (MISCELLANEOUS) ×1 IMPLANT
DRSG TEGADERM 2-3/8X2-3/4 SM (GAUZE/BANDAGES/DRESSINGS) ×1 IMPLANT
FEE CATARACT SUITE SIGHTPATH (MISCELLANEOUS) ×1 IMPLANT
GLOVE BIOGEL PI IND STRL 8 (GLOVE) ×1 IMPLANT
GLOVE SURG LX STRL 7.5 STRW (GLOVE) ×1 IMPLANT
GLOVE SURG PROTEXIS BL SZ6.5 (GLOVE) ×1 IMPLANT
GLOVE SURG SYN 6.5 PF PI BL (GLOVE) ×1 IMPLANT
LENS IOL ENVISTA 20.5 (Intraocular Lens) ×1 IMPLANT
LENS IOL ENVISTA UV+ 20.5 (Intraocular Lens) IMPLANT
NDL FILTER BLUNT 18X1 1/2 (NEEDLE) ×1 IMPLANT
NEEDLE FILTER BLUNT 18X1 1/2 (NEEDLE) ×1 IMPLANT
SYR 3ML LL SCALE MARK (SYRINGE) ×1 IMPLANT

## 2023-08-12 NOTE — Transfer of Care (Signed)
 Immediate Anesthesia Transfer of Care Note  Patient: Krista Anderson  Procedure(s) Performed: PHACOEMULSIFICATION, CATARACT, WITH IOL INSERTION 8.03 00:50.5 (Right: Eye)  Patient Location: PACU  Anesthesia Type: MAC  Level of Consciousness: awake, alert  and patient cooperative  Airway and Oxygen Therapy: Patient Spontanous Breathing and Patient connected to supplemental oxygen  Post-op Assessment: Post-op Vital signs reviewed, Patient's Cardiovascular Status Stable, Respiratory Function Stable, Patent Airway and No signs of Nausea or vomiting  Post-op Vital Signs: Reviewed and stable  Complications: No notable events documented.

## 2023-08-12 NOTE — Op Note (Signed)
 OPERATIVE NOTE  Krista Anderson [id number] 08/12/2023   PREOPERATIVE DIAGNOSIS: Nuclear sclerotic cataract right eye. H25.11   POSTOPERATIVE DIAGNOSIS: Nuclear sclerotic cataract right eye. H25.11   PROCEDURE:  Phacoemusification with posterior chamber intraocular lens placement of the right eye  Ultrasound time: Procedure(s): PHACOEMULSIFICATION, CATARACT, WITH IOL INSERTION 8.03 00:50.5 (Right)  LENS:   Implant Name Type Inv. Item Serial No. Manufacturer Lot No. LRB No. Used Action  enVista Acrylic IOL 20.50 Intraocular Lens  [id number]  [id number] Right 1 Implanted      SURGEON:  Rosy Cooper. Donalda Fruit, MD   ANESTHESIA:  Topical with tetracaine drops, augmented with 1% preservative-free intracameral lidocaine.   COMPLICATIONS:  None.   DESCRIPTION OF PROCEDURE:  The patient was identified in the holding room and transported to the operating room and placed in the supine position under the operating microscope.  The right eye was identified as the operative eye, which was prepped and draped in the usual sterile ophthalmic fashion.   A 1 millimeter clear-corneal paracentesis was made superotemporally. Preservative-free 1% lidocaine mixed with 1:1,000 bisulfite-free aqueous solution of epinephrine was injected into the anterior chamber. The anterior chamber was then filled with Viscoat viscoelastic. A 2.4 millimeter keratome was used to make a clear-corneal incision inferotemporally. A curvilinear capsulorrhexis was made with a cystotome and capsulorrhexis forceps. Balanced salt solution was used to hydrodissect and hydrodelineate the nucleus. Phacoemulsification was then used to remove the lens nucleus and epinucleus. The remaining cortex was then removed using the irrigation and aspiration handpiece. Provisc was then placed into the capsular bag to distend it for lens placement. A +20.50 D [id number] intraocular lens was then injected into the capsular bag. The remaining viscoelastic was aspirated.    Wounds were hydrated with balanced salt solution.  The anterior chamber was inflated to a physiologic pressure with balanced salt solution.  No wound leaks were noted. Moxifloxacin was injected intracamerally.  Timolol and Brimonidine drops were applied to the eye.  The patient was taken to the recovery room in stable condition without complications of anesthesia or surgery.  Krista Anderson 08/12/2023, 7:51 AM

## 2023-08-12 NOTE — H&P (Signed)
 [facility]   Primary Care Physician:  [facility] Ophthalmologist: Dr. Deberah Pelton  Pre-Procedure History & Physical: HPI:  Krista Anderson is a 78 y.o. female here for cataract surgery.   Past Medical History:  Diagnosis Date   Arthritis    Borderline diabetes    Brain aneurysm    Hypertension    Pre-diabetes    Seizure (HCC) 2023    Past Surgical History:  Procedure Laterality Date   ABDOMINAL HYSTERECTOMY     APPENDECTOMY     BACK SURGERY     CARPAL TUNNEL RELEASE Bilateral    CATARACT EXTRACTION W/PHACO Left 07/29/2023   Procedure: PHACOEMULSIFICATION, CATARACT, WITH IOL INSERTION 13.65 01:17.7;  Surgeon: Estanislado Pandy, MD;  Location: [facility];  Service: Ophthalmology;  Laterality: Left;   CHOLECYSTECTOMY     FRACTURE SURGERY     both wrists    Prior to Admission medications   Medication Sig Start Date End Date Taking? Authorizing Provider  acetaminophen (TYLENOL) 500 MG tablet Take 500 mg by mouth every 6 (six) hours as needed for mild pain.    Yes [provider]  allopurinol (ZYLOPRIM) 100 MG tablet Take 100 mg by mouth daily. 02/26/22  Yes [provider]  aspirin 81 MG chewable tablet Chew 81 mg by mouth daily. 01/16/09  Yes [provider]  fluticasone (FLONASE) 50 MCG/ACT nasal spray Place 1 spray into both nostrils 2 (two) times daily. 09/09/21  Yes [provider]  lisinopril (PRINIVIL,ZESTRIL) 20 MG tablet Take 40 mg by mouth daily.   Yes [provider]  meloxicam (MOBIC) 15 MG tablet Take 15 mg by mouth daily. 02/25/22  Yes [provider]  pravastatin (PRAVACHOL) 80 MG tablet Take 80 mg by mouth at bedtime.   Yes [provider]  VITAMIN D-1000 MAX ST 25 MCG (1000 UT) tablet Take 1,000 Units by mouth daily. 12/05/21  Yes [provider]  VOLTAREN 1 % GEL Apply 1 application topically daily as needed for pain. 01/15/16  Yes [provider]  Colchicine 0.6 MG CAPS Take 0.6 mg by mouth daily as needed (Gout). Patient not taking: Reported on 07/14/2023 02/25/22   [provider]  cyanocobalamin (VITAMIN B12) 1000 MCG tablet Take 1,000 mcg by mouth daily. Patient not taking: Reported on 07/14/2023 07/30/21   [provider]  gabapentin (NEURONTIN) 300 MG capsule Take 300 mg by mouth 3 (three) times daily. Patient not taking: Reported on 07/14/2023    [provider]  levETIRAcetam (KEPPRA) 1000 MG tablet Take 1 tablet (1,000 mg total) by mouth 2 (two) times daily. Patient not taking: Reported on 07/14/2023 03/22/22   Maretta Bees, MD  polyethylene glycol powder (GLYCOLAX/MIRALAX) powder Take 17 g by mouth daily as needed for constipation. Patient not taking: Reported on 07/14/2023 12/16/15   [provider]    Allergies as of 07/14/2023 - Review Complete 07/14/2023  Allergen Reaction Noted   Elavil [amitriptyline] Other (See Comments) 01/18/2016   Elemental sulfur Nausea Only 01/18/2016    Family History  Problem Relation Age of Onset   Breast cancer Neg Hx     Social History   Socioeconomic History   Marital status: Widowed    Spouse name: Not on file   Number of children: Not on file   Years of education: Not on file   Highest education level: Not on file  Occupational History   Not on file  Tobacco Use  Smoking status: Never   Smokeless tobacco: Never  Substance and Sexual Activity   Alcohol use: No   Drug use: Not Currently   Sexual activity: Not on file  Other Topics Concern   Not on file  Social History Narrative   Not on file   Social Drivers of Health   Financial Resource Strain: Not on file  Food Insecurity: No Food Insecurity (03/20/2022)   Hunger Vital Sign    Worried About Running Out of Food in the Last Year: Never true    Ran Out of Food in the Last Year: Never true  Transportation Needs: No Transportation Needs (03/20/2022)   PRAPARE - Therapist, art (Medical): No    Lack of Transportation (Non-Medical): No  Physical Activity: Not on file  Stress: Not on file  Social Connections: Not on file  Intimate Partner Violence: Not At Risk (03/20/2022)   Humiliation, Afraid, Rape, and Kick questionnaire    Fear of Current or Ex-Partner: No    Emotionally Abused: No    Physically Abused: No    Sexually Abused: No    Review of Systems: See HPI, otherwise negative ROS  Physical Exam: BP (!) 187/77   Pulse 61   Temp 98.1 F (36.7 C) (Temporal)   Resp 15   Ht 5' 4.02" (1.626 m)   Wt 94.2 kg   SpO2 98%   BMI 35.63 kg/m  General:   Alert, cooperative in NAD Head:  Normocephalic and atraumatic. Respiratory:  Normal work of breathing. Cardiovascular:  RRR  Impression/Plan: Krista Anderson is here for cataract surgery.  Risks, benefits, limitations, and alternatives regarding cataract surgery have been reviewed with the patient.  Questions have been answered.  All parties agreeable.   [name], MD  08/12/2023, 7:19 AM

## 2023-08-12 NOTE — Anesthesia Postprocedure Evaluation (Signed)
 Anesthesia Post Note  Patient: Krista Anderson  Procedure(s) Performed: PHACOEMULSIFICATION, CATARACT, WITH IOL INSERTION 8.03 00:50.5 (Right: Eye)  Patient location during evaluation: PACU Anesthesia Type: MAC Level of consciousness: awake and alert Pain management: pain level controlled Vital Signs Assessment: post-procedure vital signs reviewed and stable Respiratory status: spontaneous breathing, nonlabored ventilation, respiratory function stable and patient connected to nasal cannula oxygen Cardiovascular status: stable and blood pressure returned to baseline Postop Assessment: no apparent nausea or vomiting Anesthetic complications: no   No notable events documented.   Last Vitals:  Vitals:   08/12/23 0754 08/12/23 0759  BP: (!) 155/75 (!) 171/70  Pulse: 61 60  Resp: 19 17  Temp: (!) 36.1 C (!) 36.1 C  SpO2: 98% 96%    Last Pain:  Vitals:   08/12/23 0759  TempSrc:   PainSc: 0-No pain                 Emilie Harden

## 2023-09-25 ENCOUNTER — Other Ambulatory Visit: Payer: Self-pay

## 2023-09-25 ENCOUNTER — Observation Stay
Admission: EM | Admit: 2023-09-25 | Discharge: 2023-09-26 | Disposition: A | Discharge status: Home / Self Care | Attending: Internal Medicine | Admitting: Internal Medicine

## 2023-09-25 ENCOUNTER — Emergency Department

## 2023-09-25 DIAGNOSIS — Z6834 Body mass index (BMI) 34.0-34.9, adult: Secondary | ICD-10-CM | POA: Insufficient documentation

## 2023-09-25 DIAGNOSIS — M109 Gout, unspecified: Secondary | ICD-10-CM | POA: Diagnosis not present

## 2023-09-25 DIAGNOSIS — Z7982 Long term (current) use of aspirin: Secondary | ICD-10-CM | POA: Insufficient documentation

## 2023-09-25 DIAGNOSIS — I10 Essential (primary) hypertension: Secondary | ICD-10-CM | POA: Diagnosis not present

## 2023-09-25 DIAGNOSIS — E669 Obesity, unspecified: Secondary | ICD-10-CM | POA: Diagnosis not present

## 2023-09-25 DIAGNOSIS — R0609 Other forms of dyspnea: Secondary | ICD-10-CM | POA: Insufficient documentation

## 2023-09-25 DIAGNOSIS — R072 Precordial pain: Secondary | ICD-10-CM | POA: Diagnosis not present

## 2023-09-25 DIAGNOSIS — R0789 Other chest pain: Principal | ICD-10-CM | POA: Insufficient documentation

## 2023-09-25 DIAGNOSIS — R079 Chest pain, unspecified: Principal | ICD-10-CM

## 2023-09-25 LAB — APTT: aPTT: 27 s (ref 24–36)

## 2023-09-25 LAB — CBC
HCT: 44.1 % (ref 36.0–46.0)
Hemoglobin: 14.5 g/dL (ref 12.0–15.0)
MCH: 30.8 pg (ref 26.0–34.0)
MCHC: 32.9 g/dL (ref 30.0–36.0)
MCV: 93.6 fL (ref 80.0–100.0)
Platelets: 202 10*3/uL (ref 150–400)
RBC: 4.71 MIL/uL (ref 3.87–5.11)
RDW: 14.5 % (ref 11.5–15.5)
WBC: 10.9 10*3/uL — ABNORMAL HIGH (ref 4.0–10.5)
nRBC: 0 % (ref 0.0–0.2)

## 2023-09-25 LAB — BASIC METABOLIC PANEL WITH GFR
Anion gap: 13 (ref 5–15)
BUN: 16 mg/dL (ref 8–23)
CO2: 22 mmol/L (ref 22–32)
Calcium: 9.1 mg/dL (ref 8.9–10.3)
Chloride: 104 mmol/L (ref 98–111)
Creatinine, Ser: 0.84 mg/dL (ref 0.44–1.00)
GFR, Estimated: >60 mL/min (ref >60)
Glucose, Bld: 111 mg/dL — ABNORMAL HIGH (ref 70–99)
Potassium: 4.1 mmol/L (ref 3.5–5.1)
Sodium: 139 mmol/L (ref 135–145)

## 2023-09-25 LAB — LIPID PANEL
Cholesterol: 139 mg/dL (ref 0–200)
HDL: 55 mg/dL (ref >40)
LDL Cholesterol: 64 mg/dL (ref 0–99)
Total CHOL/HDL Ratio: 2.5 ratio
Triglycerides: 98 mg/dL (ref <150)
VLDL: 20 mg/dL (ref 0–40)

## 2023-09-25 LAB — TSH: TSH: 2.991 u[IU]/mL (ref 0.350–4.500)

## 2023-09-25 LAB — TROPONIN I (HIGH SENSITIVITY): Troponin I (High Sensitivity): 7 ng/L (ref <18)

## 2023-09-25 MED ORDER — ALLOPURINOL 100 MG PO TABS
100.0000 mg | ORAL_TABLET | Freq: Every day | ORAL | Status: DC
  Administered 2023-09-26: 100 mg via ORAL
  Filled 2023-09-25: qty 1

## 2023-09-25 MED ORDER — ORAL CARE MOUTH RINSE: 15.0000 mL | OROMUCOSAL | Status: DC | PRN

## 2023-09-25 MED ORDER — ONDANSETRON HCL 4 MG PO TABS: 4.0000 mg | ORAL_TABLET | Freq: Four times a day (QID) | ORAL | Status: DC | PRN

## 2023-09-25 MED ORDER — ASPIRIN 81 MG PO CHEW
324.0000 mg | CHEWABLE_TABLET | Freq: Once | ORAL | Status: AC
  Administered 2023-09-25: 324 mg via ORAL
  Filled 2023-09-25: qty 4

## 2023-09-25 MED ORDER — DICLOFENAC SODIUM 1 % EX GEL
4.0000 g | Freq: Four times a day (QID) | CUTANEOUS | Status: DC | PRN
  Filled 2023-09-25: qty 100

## 2023-09-25 MED ORDER — PRAVASTATIN SODIUM 40 MG PO TABS
80.0000 mg | ORAL_TABLET | Freq: Every day | ORAL | Status: DC
  Administered 2023-09-25: 80 mg via ORAL
  Filled 2023-09-25: qty 2

## 2023-09-25 MED ORDER — HYDROCHLOROTHIAZIDE 12.5 MG PO TABS
12.5000 mg | ORAL_TABLET | Freq: Every day | ORAL | Status: DC
  Filled 2023-09-25: qty 1

## 2023-09-25 MED ORDER — ACETAMINOPHEN 500 MG PO TABS: 500.0000 mg | ORAL_TABLET | Freq: Four times a day (QID) | ORAL | Status: DC | PRN

## 2023-09-25 MED ORDER — ENOXAPARIN SODIUM 60 MG/0.6ML IJ SOSY
45.0000 mg | PREFILLED_SYRINGE | INTRAMUSCULAR | Status: DC
  Administered 2023-09-25: 45 mg via SUBCUTANEOUS
  Filled 2023-09-25: qty 0.6

## 2023-09-25 MED ORDER — PANTOPRAZOLE SODIUM 40 MG PO TBEC
40.0000 mg | DELAYED_RELEASE_TABLET | Freq: Every day | ORAL | Status: DC
  Administered 2023-09-25 – 2023-09-26 (×2): 40 mg via ORAL
  Filled 2023-09-25 (×2): qty 1

## 2023-09-25 MED ORDER — NITROGLYCERIN 0.4 MG SL SUBL: 0.4000 mg | SUBLINGUAL_TABLET | SUBLINGUAL | Status: DC | PRN

## 2023-09-25 MED ORDER — LIDOCAINE 5 % EX PTCH
1.0000 | MEDICATED_PATCH | CUTANEOUS | Status: DC
  Administered 2023-09-25: 1 via TRANSDERMAL
  Filled 2023-09-25 (×2): qty 1

## 2023-09-25 MED ORDER — ASPIRIN 81 MG PO CHEW
81.0000 mg | CHEWABLE_TABLET | Freq: Every day | ORAL | Status: DC
  Administered 2023-09-26: 81 mg via ORAL
  Filled 2023-09-25: qty 1

## 2023-09-25 MED ORDER — HYDRALAZINE HCL 20 MG/ML IJ SOLN
5.0000 mg | Freq: Four times a day (QID) | INTRAMUSCULAR | Status: DC | PRN
  Administered 2023-09-25: 5 mg via INTRAVENOUS
  Filled 2023-09-25: qty 1

## 2023-09-25 MED ORDER — ONDANSETRON HCL 4 MG/2ML IJ SOLN: 4.0000 mg | Freq: Four times a day (QID) | INTRAMUSCULAR | Status: DC | PRN

## 2023-09-25 MED ORDER — LISINOPRIL 20 MG PO TABS
40.0000 mg | ORAL_TABLET | Freq: Every day | ORAL | Status: DC
  Administered 2023-09-25 – 2023-09-26 (×2): 40 mg via ORAL
  Filled 2023-09-25 (×2): qty 2

## 2023-09-25 NOTE — Plan of Care (Signed)
  Problem: Education: Goal: Knowledge of General Education information will improve Description: Including pain rating scale, medication(s)/side effects and non-pharmacologic comfort measures Outcome: Progressing   Problem: Clinical Measurements: Goal: Ability to maintain clinical measurements within normal limits will improve Outcome: Progressing Goal: Will remain free from infection Outcome: Progressing Goal: Cardiovascular complication will be avoided Outcome: Progressing   Problem: Activity: Goal: Risk for activity intolerance will decrease Outcome: Progressing   Problem: Coping: Goal: Level of anxiety will decrease Outcome: Progressing   Problem: Elimination: Goal: Will not experience complications related to bowel motility Outcome: Progressing   Problem: Pain Managment: Goal: General experience of comfort will improve and/or be controlled Outcome: Progressing   Problem: Safety: Goal: Ability to remain free from injury will improve Outcome: Progressing   Problem: Skin Integrity: Goal: Risk for impaired skin integrity will decrease Outcome: Progressing

## 2023-09-25 NOTE — ED Notes (Signed)
 Pt is CAOx4, breathing normally, and normal in color. Pt is complaining of sharp pain under her left breast that is intermediate and moves around to her left rib area. Pt denies any cardiac hx, but has cardiac hx in her family. Pt states pain started this morning.

## 2023-09-25 NOTE — H&P (Signed)
 History and Physical    Krista Anderson ZOX:[id number] DOB: 1945/10/02 DOA: 09/25/2023  PCP: [facility] (Confirm with patient/family/NH records and if not entered, this has to be entered at [facility] point of entry) Patient coming from: Home  I have personally briefly reviewed patient's old medical records in [facility]  Chief Complaint: Chest pain  HPI: Krista Anderson is a 78 y.o. female with medical history significant of HTN, HLD, GERD, seizure disorder who presented with new onset of chest pains.  Patient reported that lately she has been experiencing stressful events as her family member becomes sick last week.  This morning she woke up with a dull like chest pain 6-7/10, located under left breast " across like a band" associated with some shortness of breath, chest pain is worsened by upper body movement, did not notice any significant relieving factors and symptoms went away in 15 to 20 minutes.  Then she went to bathroom to have bowel movement, after she came back, she had another episode of chest pain similar quality and location lasted about 20 minutes.  Totally she had 3 episode since last night.  She measured her blood pressure this morning was noticed SBP in 190s which is higher than her usual SBP in the 170 range.  EMS was called and EMS gave her 85 mg aspirin  and chest pain subsided.  Claimed that the her father had a heart attack at age of 26 and mother has brain aneurysm. ED Course: Afebrile, nontachycardic blood pressure 190/90 O2 saturation 95% on room air.  Blood work showed troponin negative x 1 EKG showed sinus rhythm no acute ST changes.  Review of Systems: As per HPI otherwise 14 point review of systems negative.    Past Medical History:  Diagnosis Date   Arthritis    Borderline diabetes    Brain aneurysm    Hypertension    Pre-diabetes    Seizure (HCC) 2023    Past Surgical History:  Procedure Laterality Date   ABDOMINAL HYSTERECTOMY      APPENDECTOMY     BACK SURGERY     CARPAL TUNNEL RELEASE Bilateral    CATARACT EXTRACTION W/PHACO Left 07/29/2023   Procedure: PHACOEMULSIFICATION, CATARACT, WITH IOL INSERTION 13.65 01:17.7;  Surgeon: [name], MD;  Location: [facility];  Service: Ophthalmology;  Laterality: Left;   CATARACT EXTRACTION W/PHACO Right 08/12/2023   Procedure: PHACOEMULSIFICATION, CATARACT, WITH IOL INSERTION 8.03 00:50.5;  Surgeon: [name], MD;  Location: [facility];  Service: Ophthalmology;  Laterality: Right;   CHOLECYSTECTOMY     FRACTURE SURGERY     both wrists     reports that she has never smoked. She has never used smokeless tobacco. She reports that she does not currently use drugs. She reports that she does not drink alcohol.  Allergies  Allergen Reactions   Gabapentin  Other (See Comments)    seizure   Elavil [Amitriptyline] Other (See Comments)    "Makes me crazy"   Elemental Sulfur Nausea Only    Family History  Problem Relation Age of Onset   Breast cancer Neg Hx      Prior to Admission medications   Medication Sig Start Date End Date Taking? Authorizing Provider  acetaminophen  (TYLENOL ) 500 MG tablet Take 500 mg by mouth every 6 (six) hours as needed for mild pain.     [provider]  allopurinol  (ZYLOPRIM ) 100 MG tablet Take 100 mg by mouth daily. 02/26/22  [provider]  aspirin  81 MG chewable tablet Chew 81 mg by mouth daily. 01/16/09   [provider]  Colchicine  0.6 MG CAPS Take 0.6 mg by mouth daily as needed (Gout). Patient not taking: Reported on 07/14/2023 02/25/22   [provider]  cyanocobalamin (VITAMIN B12) 1000 MCG tablet Take 1,000 mcg by mouth daily. Patient not taking: Reported on 07/14/2023 07/30/21   [provider]  fluticasone (FLONASE) 50 MCG/ACT nasal spray Place 1 spray into both nostrils 2 (two) times daily. 09/09/21   [provider]  gabapentin  (NEURONTIN ) 300 MG  capsule Take 300 mg by mouth 3 (three) times daily. Patient not taking: Reported on 07/14/2023    [provider]  levETIRAcetam  (KEPPRA ) 1000 MG tablet Take 1 tablet (1,000 mg total) by mouth 2 (two) times daily. Patient not taking: Reported on 07/14/2023 03/22/22   Burton Casey, MD  lisinopril  (PRINIVIL ,ZESTRIL ) 20 MG tablet Take 40 mg by mouth daily.    [provider]  meloxicam (MOBIC) 15 MG tablet Take 15 mg by mouth daily. 02/25/22   [provider]  polyethylene glycol powder (GLYCOLAX /MIRALAX ) powder Take 17 g by mouth daily as needed for constipation. Patient not taking: Reported on 07/14/2023 12/16/15   [provider]  pravastatin  (PRAVACHOL ) 80 MG tablet Take 80 mg by mouth at bedtime.    [provider]  VITAMIN D-1000 MAX ST 25 MCG (1000 UT) tablet Take 1,000 Units by mouth daily. 12/05/21   [provider]  VOLTAREN 1 % GEL Apply 1 application topically daily as needed for pain. 01/15/16   [provider]    Physical Exam: Vitals:   09/25/23 1109 09/25/23 1114 09/25/23 1150 09/25/23 1300  BP:  (!) 178/90 (!) 194/81 (!) 193/97  Pulse:  94 84 70  Resp:  17 (!) 22 19  Temp:  97.7 F (36.5 C)    SpO2:  96% 94% 95%  Weight: 91.5 kg     Height: 5\' 4"  (1.626 m)       Constitutional: NAD, calm, comfortable Vitals:   09/25/23 1109 09/25/23 1114 09/25/23 1150 09/25/23 1300  BP:  (!) 178/90 (!) 194/81 (!) 193/97  Pulse:  94 84 70  Resp:  17 (!) 22 19  Temp:  97.7 F (36.5 C)    SpO2:  96% 94% 95%  Weight: 91.5 kg     Height: 5\' 4"  (1.626 m)      Eyes: PERRL, lids and conjunctivae normal ENMT: Mucous membranes are moist. Posterior pharynx clear of any exudate or lesions.Normal dentition.  Neck: normal, supple, no masses, no thyromegaly Respiratory: clear to auscultation bilaterally, no wheezing, no crackles. Normal respiratory effort. No accessory muscle use.  Cardiovascular: Regular rate and rhythm, no  murmurs / rubs / gallops. No extremity edema. 2+ pedal pulses. No carotid bruits.  Abdomen: no tenderness, no masses palpated. No hepatosplenomegaly. Bowel sounds positive.  Musculoskeletal: no clubbing / cyanosis. No joint deformity upper and lower extremities. Good ROM, no contractures. Normal muscle tone.  Skin: no rashes, lesions, ulcers. No induration Neurologic: CN 2-12 grossly intact. Sensation intact, DTR normal. Strength 5/5 in all 4.  Psychiatric: Normal judgment and insight. Alert and oriented x 3. Normal mood.     Labs on Admission: I have personally reviewed following labs and imaging studies  CBC: Recent Labs  Lab 09/25/23 1116  WBC 10.9*  HGB 14.5  HCT 44.1  MCV 93.6  PLT 202   Basic Metabolic Panel: Recent Labs  Lab 09/25/23 1116  NA 139  K 4.1  CL 104  CO2 22  GLUCOSE 111*  BUN 16  CREATININE 0.84  CALCIUM 9.1   GFR: Estimated Creatinine Clearance: 61.4 mL/min (by C-G formula based on SCr of 0.84 mg/dL). Liver Function Tests: No results for input(s): "AST", "ALT", "ALKPHOS", "BILITOT", "PROT", "ALBUMIN" in the last 168 hours. No results for input(s): "LIPASE", "AMYLASE" in the last 168 hours. No results for input(s): "AMMONIA" in the last 168 hours. Coagulation Profile: No results for input(s): "INR", "PROTIME" in the last 168 hours. Cardiac Enzymes: No results for input(s): "CKTOTAL", "CKMB", "CKMBINDEX", "TROPONINI" in the last 168 hours. BNP (last 3 results) No results for input(s): "PROBNP" in the last 8760 hours. HbA1C: No results for input(s): "HGBA1C" in the last 72 hours. CBG: No results for input(s): "GLUCAP" in the last 168 hours. Lipid Profile: No results for input(s): "CHOL", "HDL", "LDLCALC", "TRIG", "CHOLHDL", "LDLDIRECT" in the last 72 hours. Thyroid Function Tests: No results for input(s): "TSH", "T4TOTAL", "FREET4", "T3FREE", "THYROIDAB" in the last 72 hours. Anemia Panel: No results for input(s): "VITAMINB12", "FOLATE",  "FERRITIN", "TIBC", "IRON", "RETICCTPCT" in the last 72 hours. Urine analysis: No results found for: "COLORURINE", "APPEARANCEUR", "LABSPEC", "PHURINE", "GLUCOSEU", "HGBUR", "BILIRUBINUR", "KETONESUR", "PROTEINUR", "UROBILINOGEN", "NITRITE", "LEUKOCYTESUR"  Radiological Exams on Admission: DG Chest 2 View Result Date: 09/25/2023 CLINICAL DATA:  Acute left-sided chest pain. EXAM: CHEST - 2 VIEW COMPARISON:  None Available. FINDINGS: The heart size and mediastinal contours are within normal limits. Both lungs are clear. The visualized skeletal structures are unremarkable. IMPRESSION: No active cardiopulmonary disease. Electronically Signed   By: Marlyce Sine M.D.   On: 09/25/2023 12:02    EKG: Independently reviewed.  Sinus rhythm, no acute ST changes.  Assessment/Plan Principal Problem:   Chest pain  (please populate well all problems here in Problem List. (For example, if patient is on BP meds at home and you resume or decide to hold them, it is a problem that needs to be her. Same for CAD, COPD, HLD and so on)  Nonspecific chest pain, rule out ACS - Chest pain probably related to uncontrolled hypertension, currently troponin negative x 1 and EKG showed no ST changes. - Continue telemonitoring, as needed EKG for chest pains - As needed nitroglycerin - Echocardiogram - BP control as below - Expect patient can be discharged home if workup negative and follow-up with PCP and cardiology for outpatient stress test.  Strategy explained to family and patient, both expressed understanding and agreed. - Continue aspirin , and statin, check lipid panel.  HTN emergency - HTN emergency with acute endorgan damage of chest pain - Continue lisinopril  - Add hydrochlorothiazide - Add as needed hydralazine  Gout - Stable, continue allopurinol   Obesity - BMI= 34 - Calorie control recommended  DVT prophylaxis: Lovenox  Code Status: Full code Family Communication: Son at bedside Disposition Plan:  Expect less than 2 midnight hospital stay Consults called: None Admission status: Telemetry observation   [place] MD [facility] Pager [phone]  09/25/2023, 1:22 PM

## 2023-09-25 NOTE — ED Triage Notes (Signed)
 Pt comes in from [facility] with complaints of chest pain under left breast that started at about 9am this morning. Pt complains of tingling down her left arm, but no dizziness or shortness of breath. Pt is alert and oriented x4 with no signs of acute distress at this time.

## 2023-09-25 NOTE — ED Provider Notes (Signed)
 [facility] Provider Note    Event Date/Time   First MD Initiated Contact with Patient 09/25/23 1147     (approximate)   History   Chief Complaint: Chest Pain   HPI  Krista Anderson is a 78 y.o. female who comes ED complaining of left-sided chest pain radiating to the left arm that started at 2:30 AM this morning waking her from sleep.  Lasted about 30 minutes at that time and then went away.  She has had several more episodes of recurrent pain throughout the morning, severe when present, usually resolves after about a minute or so.  She also noted this morning dyspnea with walking short distances.  Over the last few weeks she has noticed dyspnea on exertion developing and slowly worsening before today.        Past Medical History:  Diagnosis Date   Arthritis    Borderline diabetes    Brain aneurysm    Hypertension    Pre-diabetes    Seizure [facility]) 2023    Current Outpatient Rx   Order #: [id number] Class: Historical Med   Order #: [id number] Class: Historical Med   Order #: [id number] Class: Historical Med   Order #: [id number] Class: Historical Med   Order #: [id number] Class: Historical Med   Order #: [id number] Class: Historical Med   Order #: [id number] Class: Historical Med   Order #: [id number] Class: Normal   Order #: [id number] Class: Historical Med   Order #: [id number] Class: Historical Med   Order #: [id number] Class: Historical Med   Order #: [id number] Class: Historical Med   Order #: [id number] Class: Historical Med   Order #: [id number] Class: Historical Med    Past Surgical History:  Procedure Laterality Date   ABDOMINAL HYSTERECTOMY     APPENDECTOMY     BACK SURGERY     CARPAL TUNNEL RELEASE Bilateral    CATARACT EXTRACTION W/PHACO Left 07/29/2023   Procedure: PHACOEMULSIFICATION, CATARACT, WITH IOL INSERTION 13.65 01:17.7;  Surgeon: [name], MD;  Location: [facility];  Service: Ophthalmology;  Laterality: Left;   CATARACT  EXTRACTION W/PHACO Right 08/12/2023   Procedure: PHACOEMULSIFICATION, CATARACT, WITH IOL INSERTION 8.03 00:50.5;  Surgeon: [name], MD;  Location: [facility];  Service: Ophthalmology;  Laterality: Right;   CHOLECYSTECTOMY     FRACTURE SURGERY     both wrists    Physical Exam   Triage Vital Signs: ED Triage Vitals  Encounter Vitals Group     BP 09/25/23 1114 (!) 178/90     Systolic BP Percentile --      Diastolic BP Percentile --      Pulse Rate 09/25/23 1114 94     Resp 09/25/23 1114 17     Temp 09/25/23 1114 97.7 F (36.5 C)     Temp src --      SpO2 09/25/23 1114 96 %     Weight 09/25/23 1109 201 lb 12.8 oz (91.5 kg)     Height 09/25/23 1109 5\' 4"  (1.626 m)     Head Circumference --      Peak Flow --      Pain Score 09/25/23 1111 8     Pain Loc --      Pain Education --      Exclude from Growth Chart --     Most recent vital signs: Vitals:   09/25/23 1150 09/25/23 1300  BP: (!) 194/81 (!) 193/97  Pulse: 84 70  Resp: (!) 22 19  Temp:    SpO2: 94% 95%  General: Awake, no distress.  CV:  Good peripheral perfusion.  Regular rate and rhythm.  Normal distal pulses Resp:  Normal effort.  Clear to auscultation Abd:  No distention.  Soft nontender Other:  No lower extremity edema.  No chest wall tenderness   ED Results / Procedures / Treatments   Labs (all labs ordered are listed, but only abnormal results are displayed) Labs Reviewed  BASIC METABOLIC PANEL WITH GFR - Abnormal; Notable for the following components:      Result Value   Glucose, Bld 111 (*)    All other components within normal limits  CBC - Abnormal; Notable for the following components:   WBC 10.9 (*)    All other components within normal limits  APTT  TSH  TROPONIN I (HIGH SENSITIVITY)  TROPONIN I (HIGH SENSITIVITY)     EKG Interpreted by me Sinus rhythm rate of 80.  Normal axis intervals QRS ST segments T waves   RADIOLOGY Chest x-ray interpreted by me,  unremarkable.  Radiology report reviewed   PROCEDURES:  Procedures   MEDICATIONS ORDERED IN ED: Medications  nitroGLYCERIN (NITROSTAT) SL tablet 0.4 mg (has no administration in time range)  lidocaine  (LIDODERM ) 5 % 1 patch (has no administration in time range)  acetaminophen  (TYLENOL ) tablet 500 mg (has no administration in time range)  allopurinol  (ZYLOPRIM ) tablet 100 mg (has no administration in time range)  aspirin  chewable tablet 81 mg (has no administration in time range)  lisinopril  (ZESTRIL ) tablet 40 mg (has no administration in time range)  pravastatin  (PRAVACHOL ) tablet 80 mg (has no administration in time range)  pantoprazole (PROTONIX) EC tablet 40 mg (has no administration in time range)  enoxaparin  (LOVENOX ) injection 45 mg (has no administration in time range)  ondansetron  (ZOFRAN ) tablet 4 mg (has no administration in time range)    Or  ondansetron  (ZOFRAN ) injection 4 mg (has no administration in time range)  hydrochlorothiazide (HYDRODIURIL) tablet 12.5 mg (has no administration in time range)  aspirin  chewable tablet 324 mg (324 mg Oral Given 09/25/23 1159)     IMPRESSION / MDM / ASSESSMENT AND PLAN / ED COURSE  I reviewed the triage vital signs and the nursing notes.  DDx: Non-STEMI, GERD, electrolyte derangement, anemia  Patient's presentation is most consistent with acute presentation with potential threat to life or bodily function.  Patient presents with intermittent chest pain today in the setting of 2 weeks of worsening dyspnea on exertion.  Concern for NSTEMI.  Will try nitroglycerin, give full dose aspirin , check labs.  Will need to hospitalize for further cardiac workup due to elevated risk.     Clinical Course as of 09/25/23 1325  Sat Sep 25, 2023  1324 Case d/w hospitalist [PS]    Clinical Course User Index [PS] Jacquie Maudlin, MD     FINAL CLINICAL IMPRESSION(S) / ED DIAGNOSES   Final diagnoses:  Chest pain with moderate risk for  cardiac etiology  DOE (dyspnea on exertion)     Rx / DC Orders   ED Discharge Orders     None        Note:  This document was prepared using Dragon voice recognition software and may include unintentional dictation errors.   Jacquie Maudlin, MD 09/25/23 1325

## 2023-09-25 NOTE — ED Notes (Signed)
 Called CCMD and added pt to board.

## 2023-09-26 ENCOUNTER — Observation Stay
Admit: 2023-09-26 | Discharge: 2023-09-26 | Disposition: A | Discharge status: Home / Self Care | Attending: Internal Medicine | Admitting: Internal Medicine

## 2023-09-26 DIAGNOSIS — R079 Chest pain, unspecified: Secondary | ICD-10-CM | POA: Diagnosis not present

## 2023-09-26 DIAGNOSIS — R0789 Other chest pain: Secondary | ICD-10-CM | POA: Diagnosis not present

## 2023-09-26 LAB — ECHOCARDIOGRAM COMPLETE
AR max vel: 2.35 cm2
AV Peak grad: 7.1 mmHg
Ao pk vel: 1.33 m/s
Area-P 1/2: 3.89 cm2
Height: 64 in
S' Lateral: 2.6 cm
Weight: 3216 [oz_av]

## 2023-09-26 LAB — TROPONIN I (HIGH SENSITIVITY): Troponin I (High Sensitivity): 7 ng/L (ref <18)

## 2023-09-26 NOTE — Discharge Instructions (Signed)
 Patient advised to return to ER if send symptoms returned.

## 2023-09-26 NOTE — Care Management Obs Status (Signed)
 MEDICARE OBSERVATION STATUS NOTIFICATION   Patient Details  Name: Krista Anderson MRN: [id number] Date of Birth: 1946-04-12   Medicare Observation Status Notification Given:  No (patient did not want a copy)    Anise Kerns 09/26/2023, 12:56 PM

## 2023-09-26 NOTE — TOC CM/SW Note (Signed)
 Transition of Care [facility]) - Inpatient Brief Assessment   Patient Details  Name: Krista Anderson MRN: [id number] Date of Birth: 19-Jul-1945  Transition of Care [facility]) CM/SW Contact:    Loman Risk, RN Phone Number: 09/26/2023, 11:04 AM   Clinical Narrative:  Transition of Care [facility]) - Inpatient Brief Assessment   Patient Details  Name: Krista Anderson MRN: [id number] Date of Birth: December 29, 1945  Transition of Care [facility]) CM/SW Contact:    Loman Risk, RN Phone Number: 09/26/2023, 11:04 AM   Clinical Narrative:   Transition of Care (TOC) Screening Note   Patient Details  Name: Krista Anderson Date of Birth: 09-23-1945   Transition of Care [facility]) CM/SW Contact:    Loman Risk, RN Phone Number: 09/26/2023, 11:04 AM    Transition of Care Department [facility]) has reviewed patient and no TOC needs have been identified at this time. . If new patient transition needs arise, please place a TOC consult.    Transition of Care Asessment: Insurance and Status: Insurance coverage has been reviewed Patient has primary care physician: Yes     Prior/Current Home Services: No current home services Social Drivers of Health Review: SDOH reviewed no interventions necessary Readmission risk has been reviewed: Yes Transition of care needs: no transition of care needs at this time   Transition of Care Asessment: Insurance and Status: Insurance coverage has been reviewed Patient has primary care physician: Yes     Prior/Current Home Services: No current home services Social Drivers of Health Review: SDOH reviewed no interventions necessary Readmission risk has been reviewed: Yes Transition of care needs: no transition of care needs at this time

## 2023-09-26 NOTE — Discharge Summary (Signed)
 Physician Discharge Summary   Patient: Krista Anderson MRN: [id number] DOB: 06-May-1945  Admit date:     09/25/2023  Discharge date: 09/26/23  Discharge Physician: Melvinia Stager   PCP: [facility]   Recommendations at discharge:   follow-up with PCP. PCP to refer cardiology deemed appropriate. Patient will return to ER/urgent care send symptoms wo  Discharge Diagnoses: Principal Problem:   Chest pain   Krista Anderson is a 78 y.o. female with medical history significant of HTN, HLD, GERD, seizure disorder who presented with new onset of chest pains.   Patient reported that lately she has been experiencing stressful events as her family member becomes sick last week.  This morning she woke up with a dull like chest pain 6-7/10, located under left breast " across like a band" associated with some shortness of breath, chest pain is worsened by upper body movement, did not notice any significant relieving factors and symptoms went away in 15 to 20 minutes.    Patient currently is asymptomatic and at baseline. She thinks it could be muscular strain.  Nonspecific chest pain, ruled out ACS - Chest pain probably related to uncontrolled hypertension, recent stressor with sick family member -- currently troponin negative x 2 and EKG showed no ST changes. - As needed nitroglycerin - Echocardiogram shows EF of 60 to 65%. -Blood pressure much improved - Continue aspirin , and statin --pt CP free. I have advised her to call [facility] cardiology(Dr [facility]) to get Myoview stress done as outpt   HTN  - Continue lisinopril  - Add as needed hydralazine   Gout - Stable, continue allopurinol    Obesity - BMI= 34 - Calorie control recommended  D/c to home. Pt agreeable. No family at bedside      Consultants: none Disposition: Home Diet recommendation:  Discharge Diet Orders (From admission, onward)     Start     Ordered   09/26/23 0000  Diet - low sodium heart healthy         09/26/23 1108           Cardiac diet DISCHARGE MEDICATION: Allergies as of 09/26/2023       Reactions   Gabapentin  Other (See Comments)   seizure   Elavil [amitriptyline] Other (See Comments)   "Makes me crazy"   Elemental Sulfur Nausea Only        Medication List     STOP taking these medications    Colchicine  0.6 MG Caps   cyanocobalamin 1000 MCG tablet Commonly known as: VITAMIN B12   fluticasone 50 MCG/ACT nasal spray Commonly known as: FLONASE   gabapentin  300 MG capsule Commonly known as: NEURONTIN    levETIRAcetam  1000 MG tablet Commonly known as: KEPPRA    polyethylene glycol powder 17 GM/SCOOP powder Commonly known as: GLYCOLAX /MIRALAX        TAKE these medications    acetaminophen  500 MG tablet Commonly known as: TYLENOL  Take 500 mg by mouth every 6 (six) hours as needed for mild pain.   allopurinol  100 MG tablet Commonly known as: ZYLOPRIM  Take 200 mg by mouth daily.   aspirin  81 MG chewable tablet Chew 81 mg by mouth daily.   lisinopril  40 MG tablet Commonly known as: ZESTRIL  Take 40 mg by mouth daily.   meloxicam 15 MG tablet Commonly known as: MOBIC Take 15 mg by mouth daily.   pravastatin  80 MG tablet Commonly known as: PRAVACHOL  Take 80 mg by mouth at bedtime.   Vitamin D-1000 Max St 25 MCG (  1000 UT) tablet Generic drug: Cholecalciferol Take 1,000 Units by mouth daily.   Voltaren 1 % Gel Generic drug: diclofenac Sodium Apply 1 application topically daily as needed for pain.        Follow-up Information     Center, [facility]. Schedule an appointment as soon as possible for a visit in 1 week(s).   Specialty: General Practice Why: Follow-up atypical chest pain. PCP to consider referral to cardiology deemed appropriate Contact information: [address] [place] [address] [phone]                Discharge Exam: Filed Weights   09/25/23 1109 09/25/23 1518  Weight:  91.5 kg 91.2 kg   Morbid Obesity Rest CTA CV S1s2 normal no murmur. No pain on palpation Abd soft NT   Condition at discharge: fair  The results of significant diagnostics from this hospitalization (including imaging, microbiology, ancillary and laboratory) are listed below for reference.   Imaging Studies: ECHOCARDIOGRAM COMPLETE Result Date: 09/26/2023    ECHOCARDIOGRAM REPORT   Patient Name:   Krista Anderson Date of Exam: 09/26/2023 Medical Rec #:  [id number]   Height:       64.0 in Accession #:    [id number]  Weight:       201.0 lb Date of Birth:  December 05, 1945   BSA:          1.961 m Patient Age:    78 years    BP:           149/66 mmHg Patient Gender: F           HR:           62 bpm. Exam Location:  [facility] Procedure: 2D Echo, Cardiac Doppler and Color Doppler (Both Spectral and Color            Flow Doppler were utilized during procedure). Indications:     Chest Pain R07.9  History:         Patient has prior history of Echocardiogram examinations.  Sonographer:     Jane Meager RDCS Referring Phys:  [id number] [place] Diagnosing Phys: Antonette Batters MD  Sonographer Comments: Image acquisition challenging due to patient body habitus and Image acquisition challenging due to respiratory motion. IMPRESSIONS  1. TDS.  2. Left ventricular ejection fraction, by estimation, is 60 to 65%. The left ventricle has normal function. The left ventricle has no regional wall motion abnormalities. Left ventricular diastolic parameters are consistent with Grade I diastolic dysfunction (impaired relaxation).  3. Right ventricular systolic function is low normal. The right ventricular size is mildly enlarged.  4. The mitral valve is normal in structure. Trivial mitral valve regurgitation.  5. The aortic valve is normal in structure. Aortic valve regurgitation is not visualized. FINDINGS  Left Ventricle: Left ventricular ejection fraction, by estimation, is 60 to 65%. The left ventricle has normal function. The left  ventricle has no regional wall motion abnormalities. Strain was performed and the global longitudinal strain is indeterminate. Global longitudinal strain performed but not reported based on interpreter judgement due to suboptimal tracking. The left ventricular internal cavity size was normal in size. There is borderline concentric left ventricular hypertrophy. Left ventricular diastolic parameters are consistent with Grade I diastolic dysfunction (impaired relaxation). Right Ventricle: The right ventricular size is mildly enlarged. No increase in right ventricular wall thickness. Right ventricular systolic function is low normal. Left Atrium: Left atrial size was normal in size. Right Atrium:  Right atrial size was normal in size. Pericardium: There is no evidence of pericardial effusion. Mitral Valve: The mitral valve is normal in structure. Trivial mitral valve regurgitation. Tricuspid Valve: The tricuspid valve is normal in structure. Tricuspid valve regurgitation is mild. Aortic Valve: The aortic valve is normal in structure. Aortic valve regurgitation is not visualized. Aortic valve peak gradient measures 7.1 mmHg. Pulmonic Valve: The pulmonic valve was normal in structure. Pulmonic valve regurgitation is mild. Aorta: The ascending aorta was not well visualized. IAS/Shunts: No atrial level shunt detected by color flow Doppler. Additional Comments: TDS. 3D was performed not requiring image post processing on an independent workstation and was indeterminate.  LEFT VENTRICLE PLAX 2D LVIDd:         3.80 cm   Diastology LVIDs:         2.60 cm   LV e' medial:    5.66 cm/s LV PW:         1.10 cm   LV E/e' medial:  12.1 LV IVS:        1.10 cm   LV e' lateral:   5.87 cm/s LVOT diam:     2.00 cm   LV E/e' lateral: 11.7 LV SV:         69 LV SV Index:   35 LVOT Area:     3.14 cm  RIGHT VENTRICLE RV Basal diam:  3.00 cm RV S prime:     21.30 cm/s TAPSE (M-mode): 2.4 cm LEFT ATRIUM             Index        RIGHT ATRIUM            Index LA diam:        3.00 cm 1.53 cm/m   RA Area:     16.50 cm LA Vol (A2C):   45.5 ml 23.21 ml/m  RA Volume:   45.80 ml  23.36 ml/m LA Vol (A4C):   39.5 ml 20.15 ml/m LA Biplane Vol: 43.5 ml 22.19 ml/m  AORTIC VALVE                 PULMONIC VALVE AV Area (Vmax): 2.35 cm     PV Vmax:        0.97 m/s AV Vmax:        133.00 cm/s  PV Peak grad:   3.8 mmHg AV Peak Grad:   7.1 mmHg     RVOT Peak grad: 2 mmHg LVOT Vmax:      99.30 cm/s LVOT Vmean:     63.900 cm/s LVOT VTI:       0.220 m  AORTA Ao Root diam: 3.00 cm Ao Asc diam:  2.90 cm MITRAL VALVE                TRICUSPID VALVE MV Area (PHT): 3.89 cm     TR Peak grad:   24.4 mmHg MV Decel Time: 195 msec     TR Vmax:        247.00 cm/s MV E velocity: 68.70 cm/s MV A velocity: 114.00 cm/s  SHUNTS MV E/A ratio:  0.60         Systemic VTI:  0.22 m                             Systemic Diam: 2.00 cm Dwayne Charlett Conroy MD Electronically signed by Antonette Batters MD Signature Date/Time: 09/26/2023/10:26:58 AM    Final  DG Chest 2 View Result Date: 09/25/2023 CLINICAL DATA:  Acute left-sided chest pain. EXAM: CHEST - 2 VIEW COMPARISON:  None Available. FINDINGS: The heart size and mediastinal contours are within normal limits. Both lungs are clear. The visualized skeletal structures are unremarkable. IMPRESSION: No active cardiopulmonary disease. Electronically Signed   By: Marlyce Sine M.D.   On: 09/25/2023 12:02    Microbiology: Results for orders placed or performed during the hospital encounter of 02/21/16  Surgical pcr screen     Status: None   Collection Time: 02/21/16  9:43 AM   Specimen: Nasal Mucosa; Nasal Swab  Result Value Ref Range Status   MRSA, PCR NEGATIVE NEGATIVE Final   Staphylococcus aureus NEGATIVE NEGATIVE Final    Comment:        The Xpert SA Assay (FDA approved for NASAL specimens in patients over 83 years of age), is one component of a comprehensive surveillance program.  Test performance has been validated by  [facility] for patients greater than or equal to 34 year old. It is not intended to diagnose infection nor to guide or monitor treatment.     Labs: CBC: Recent Labs  Lab 09/25/23 1116  WBC 10.9*  HGB 14.5  HCT 44.1  MCV 93.6  PLT 202   Basic Metabolic Panel: Recent Labs  Lab 09/25/23 1116  NA 139  K 4.1  CL 104  CO2 22  GLUCOSE 111*  BUN 16  CREATININE 0.84  CALCIUM 9.1   Discharge time spent: greater than 30 minutes.  Signed: Melvinia Stager, MD [facility] 09/26/2023

## 2023-09-26 NOTE — Plan of Care (Signed)

## 2023-09-28 IMAGING — MR MR SHOULDER*L* W/O CM
4 of 5 series · 28 of 40 positions shown · non-contrast
Comparison: None Available.

CLINICAL DATA: Two years of left shoulder pain and weakness. No
prior surgery.

EXAM:
MRI OF THE LEFT SHOULDER WITHOUT CONTRAST
TECHNIQUE: Multiplanar, multisequence MR imaging of the shoulder was performed.
No intravenous contrast was administered.

[Series 8: PD · oblique · left · 4.0mm · 0.55mm/px · 7 of 19 slices shown]
[im 1/19]
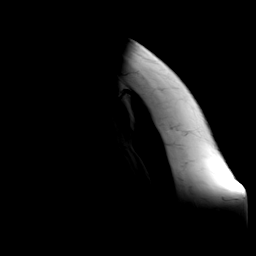
[im 4/19]
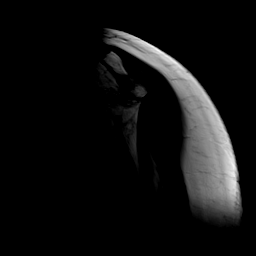
[im 7/19]
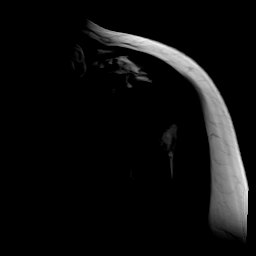
[im 10/19]
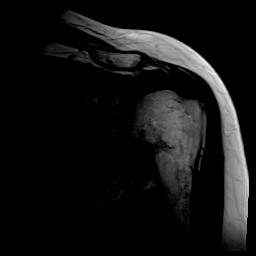
[im 13/19]
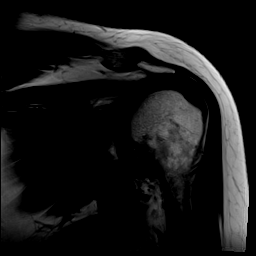
[im 16/19]
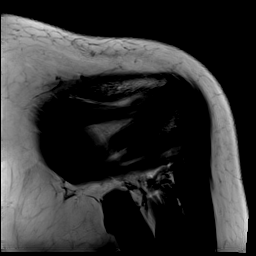
[im 19/19]
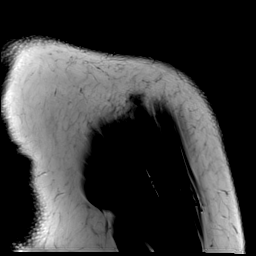

[Series 9: T2 fat-sat · oblique · left · 4.0mm · 0.55mm/px · 8 of 20 slices shown (1 of 3)]
[im 1/20]
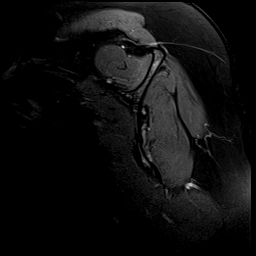
[im 3/20]
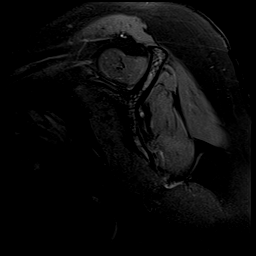
[im 6/20]
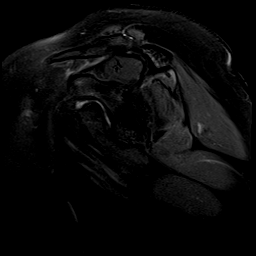
[im 9/20]
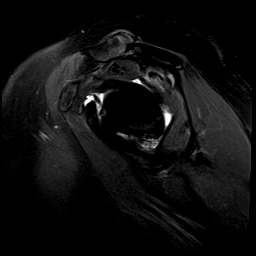
[im 11/20]
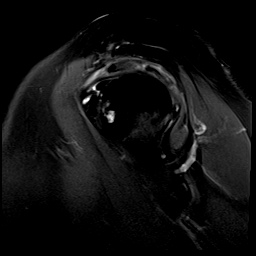
[im 14/20]
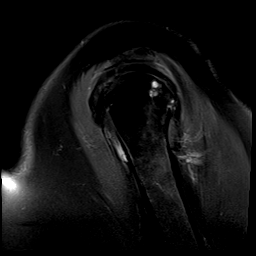
[im 17/20]
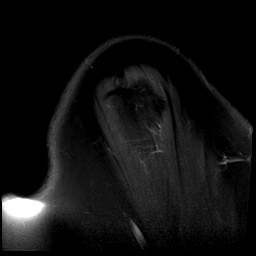
[im 20/20]
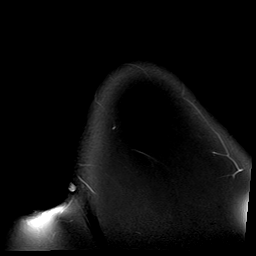

[Series 11: T2 fat-sat · axial · left · 4.0mm · 0.27mm/px · z∈[-60,+46]mm · 9 of 23 slices shown (2 of 3)]
[im 1/23]
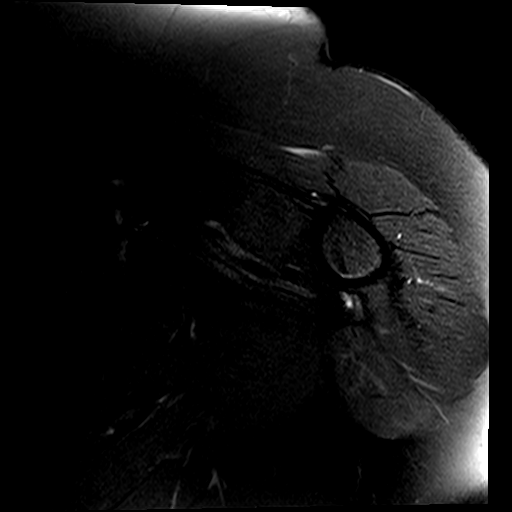
[im 3/23]
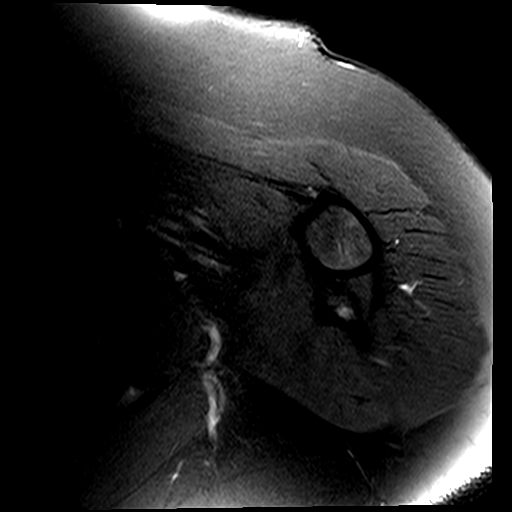
[im 6/23]
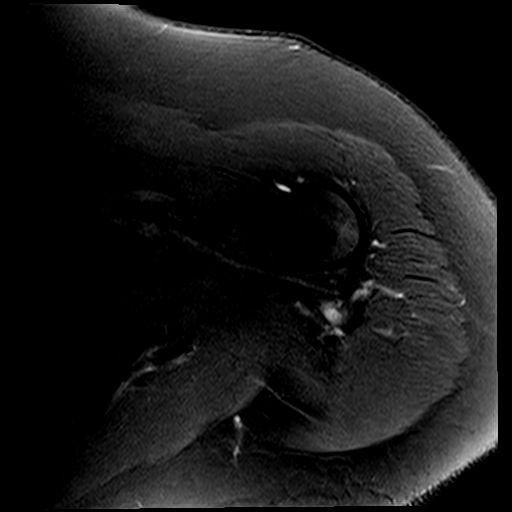
[im 9/23]
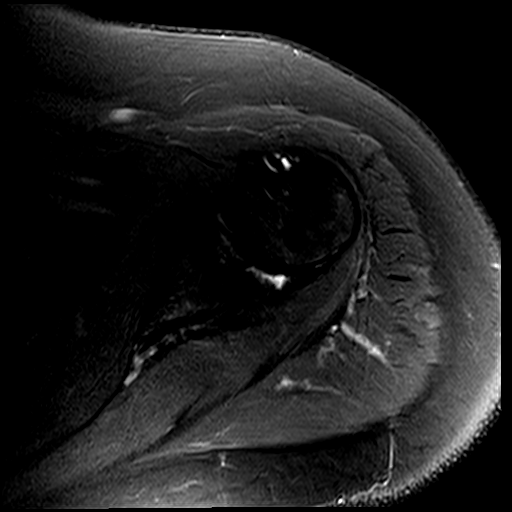
[im 12/23]
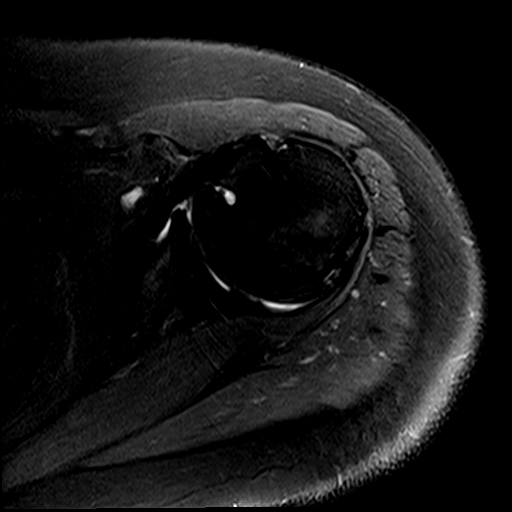
[im 14/23]
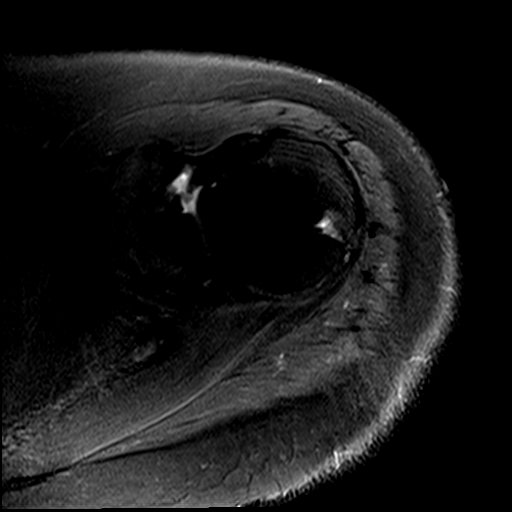
[im 17/23]
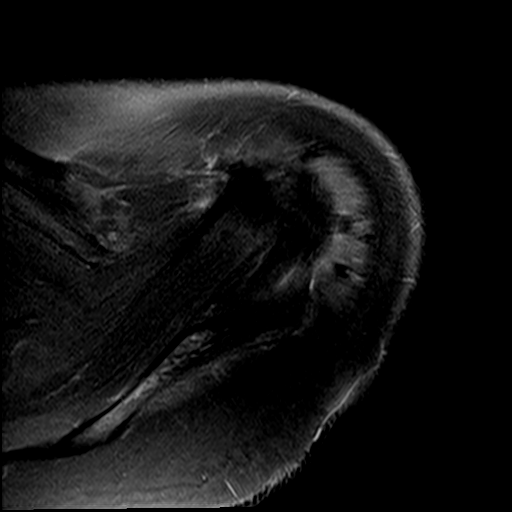
[im 20/23]
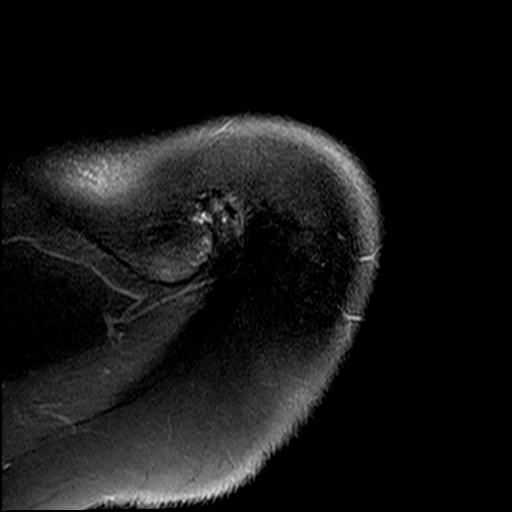
[im 23/23]
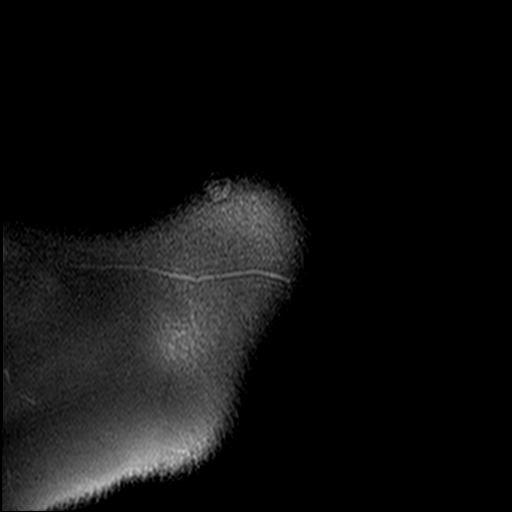

[Series 12: T2 fat-sat · oblique · left · 4.0mm · 0.27mm/px · 4 of 19 slices shown (3 of 3)]
[im 1/19]
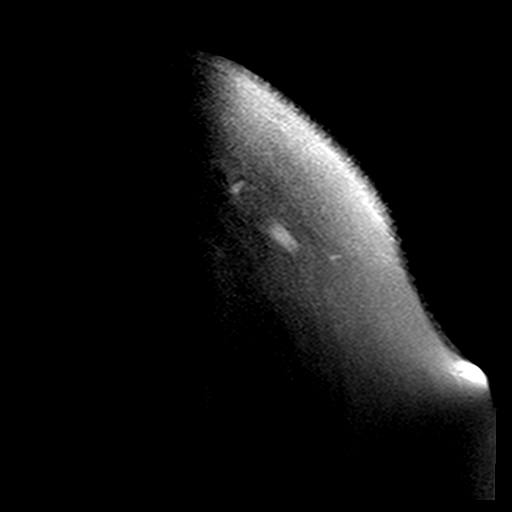
[im 3/19]
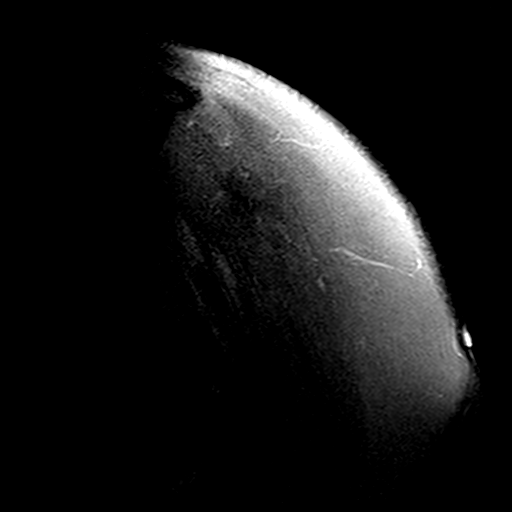
[im 11/19]
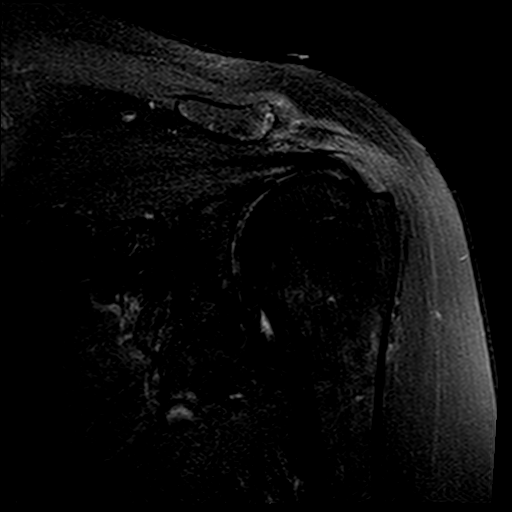
[im 16/19]
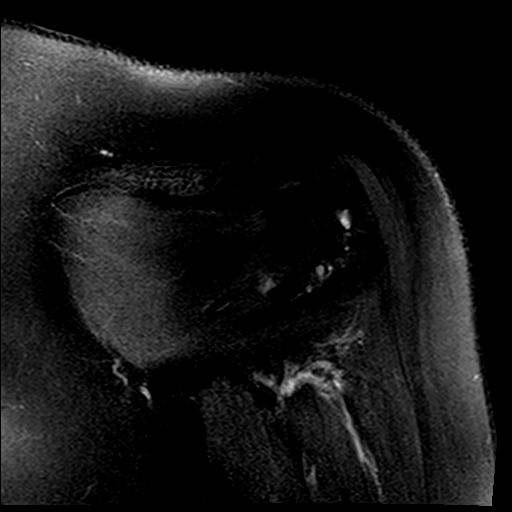

[28 of 40 positions shown; findings below may reference images not displayed]

FINDINGS: Rotator cuff: There is mild intermediate T2 signal tendinosis of the
posterior greater than anterior aspects of the supraspinatus tendon
footprint (coronal series 12 images 6 through 9). No fluid bright
signal is seen, however given the morphology of some of the
intermediate T2 signal within the mid AP dimension of the
supraspinatus tendon footprint (coronal images 7 and 8), it is
difficult to exclude a tiny 1-2 mm partial-thickness midsubstance
tear. No tendon retraction. Mild anterior infraspinatus
musculotendinous junction edema. Moderate subcortical cystic change
within the posterior superolateral humeral head deep to the mid AP
dimension of the infraspinatus tendon insertion. No tendon tear.
Mild superior subscapularis intermediate T2 signal tendinosis. The
teres minor is intact.

Muscles: No rotator cuff muscle atrophy, fatty infiltration, or
edema.

Biceps long head: The intra-articular long head of the biceps tendon
is intact.

Acromioclavicular Joint: There are mild degenerative changes of the
acromioclavicular joint including joint space narrowing, subchondral
marrow edema, and peripheral osteophytosis. Type II acromion. No
subacromial/subdeltoid bursitis.

Glenohumeral Joint: Full-thickness cartilage loss within the mid to
posterior glenoid fossa diffusely (axial series 11 images 11 through
14). Mild-to-moderate thinning of the anterior glenoid cartilage.
Moderate thinning of the superomedial and inferior medial humeral
head cartilage. Moderate inferior humeral head-neck junction
degenerative osteophytosis.

Labrum: Mild peripheral degenerative irregularity of the glenoid
labrum.

Bones:  No acute fracture.

Other: None.
IMPRESSION: :
IMPRESSION: 1. Mild posterior greater than anterior supraspinatus tendinosis.
Possible tiny 1-2 mm partial-thickness midsubstance tear of the mid
AP dimension of the supraspinatus tendon footprint.
2. Mild anterior infraspinatus musculotendinous junction edema.
3. Mild superior subscapularis insertional tendinosis.
4. Mild degenerative changes of the acromioclavicular joint.
5. Moderate to severe glenohumeral osteoarthritis.

## 2023-12-05 ENCOUNTER — Emergency Department

## 2023-12-05 ENCOUNTER — Other Ambulatory Visit: Payer: Self-pay

## 2023-12-05 ENCOUNTER — Observation Stay
Admission: EM | Admit: 2023-12-05 | Discharge: 2023-12-07 | Disposition: A | Discharge status: Home / Self Care | Attending: Cardiovascular Disease | Admitting: Cardiovascular Disease

## 2023-12-05 DIAGNOSIS — I160 Hypertensive urgency: Secondary | ICD-10-CM | POA: Diagnosis not present

## 2023-12-05 DIAGNOSIS — I25110 Atherosclerotic heart disease of native coronary artery with unstable angina pectoris: Secondary | ICD-10-CM | POA: Diagnosis not present

## 2023-12-05 DIAGNOSIS — Z7982 Long term (current) use of aspirin: Secondary | ICD-10-CM | POA: Insufficient documentation

## 2023-12-05 DIAGNOSIS — E785 Hyperlipidemia, unspecified: Secondary | ICD-10-CM | POA: Insufficient documentation

## 2023-12-05 DIAGNOSIS — Z8673 Personal history of transient ischemic attack (TIA), and cerebral infarction without residual deficits: Secondary | ICD-10-CM | POA: Insufficient documentation

## 2023-12-05 DIAGNOSIS — I200 Unstable angina: Principal | ICD-10-CM

## 2023-12-05 DIAGNOSIS — I10 Essential (primary) hypertension: Secondary | ICD-10-CM | POA: Diagnosis present

## 2023-12-05 DIAGNOSIS — I639 Cerebral infarction, unspecified: Secondary | ICD-10-CM | POA: Diagnosis present

## 2023-12-05 DIAGNOSIS — R079 Chest pain, unspecified: Secondary | ICD-10-CM | POA: Diagnosis present

## 2023-12-05 DIAGNOSIS — Z79899 Other long term (current) drug therapy: Secondary | ICD-10-CM | POA: Insufficient documentation

## 2023-12-05 LAB — BASIC METABOLIC PANEL WITH GFR
Anion gap: 9 (ref 5–15)
BUN: 11 mg/dL (ref 8–23)
CO2: 25 mmol/L (ref 22–32)
Calcium: 9.4 mg/dL (ref 8.9–10.3)
Chloride: 107 mmol/L (ref 98–111)
Creatinine, Ser: 1.02 mg/dL — ABNORMAL HIGH (ref 0.44–1.00)
GFR, Estimated: 57 mL/min — ABNORMAL LOW (ref >60)
Glucose, Bld: 99 mg/dL (ref 70–99)
Potassium: 4.1 mmol/L (ref 3.5–5.1)
Sodium: 141 mmol/L (ref 135–145)

## 2023-12-05 LAB — HEPATIC FUNCTION PANEL
ALT: 16 U/L (ref 0–44)
AST: 21 U/L (ref 15–41)
Albumin: 3.7 g/dL (ref 3.5–5.0)
Alkaline Phosphatase: 71 U/L (ref 38–126)
Bilirubin, Direct: 0.1 mg/dL (ref 0.0–0.2)
Indirect Bilirubin: 0.7 mg/dL (ref 0.3–0.9)
Total Bilirubin: 0.8 mg/dL (ref 0.0–1.2)
Total Protein: 7.1 g/dL (ref 6.5–8.1)

## 2023-12-05 LAB — CBC WITH DIFFERENTIAL/PLATELET
Abs Immature Granulocytes: 0.01 K/uL (ref 0.00–0.07)
Basophils Absolute: 0.1 K/uL (ref 0.0–0.1)
Basophils Relative: 1 %
Eosinophils Absolute: 0.3 K/uL (ref 0.0–0.5)
Eosinophils Relative: 3 %
HCT: 43.8 % (ref 36.0–46.0)
Hemoglobin: 13.9 g/dL (ref 12.0–15.0)
Immature Granulocytes: 0 %
Lymphocytes Relative: 30 %
Lymphs Abs: 2.7 K/uL (ref 0.7–4.0)
MCH: 30.3 pg (ref 26.0–34.0)
MCHC: 31.7 g/dL (ref 30.0–36.0)
MCV: 95.4 fL (ref 80.0–100.0)
Monocytes Absolute: 0.6 K/uL (ref 0.1–1.0)
Monocytes Relative: 7 %
Neutro Abs: 5.3 K/uL (ref 1.7–7.7)
Neutrophils Relative %: 59 %
Platelets: 203 K/uL (ref 150–400)
RBC: 4.59 MIL/uL (ref 3.87–5.11)
RDW: 14.6 % (ref 11.5–15.5)
WBC: 8.9 K/uL (ref 4.0–10.5)
nRBC: 0 % (ref 0.0–0.2)

## 2023-12-05 LAB — TROPONIN I (HIGH SENSITIVITY)
Troponin I (High Sensitivity): 5 ng/L (ref <18)
Troponin I (High Sensitivity): 5 ng/L (ref <18)

## 2023-12-05 LAB — LIPASE, BLOOD: Lipase: 31 U/L (ref 11–51)

## 2023-12-05 MED ORDER — SENNOSIDES-DOCUSATE SODIUM 8.6-50 MG PO TABS: 1.0000 | ORAL_TABLET | Freq: Every evening | ORAL | Status: DC | PRN

## 2023-12-05 MED ORDER — MORPHINE SULFATE (PF) 2 MG/ML IV SOLN
2.0000 mg | Freq: Once | INTRAVENOUS | Status: AC
  Administered 2023-12-05: 2 mg via INTRAVENOUS
  Filled 2023-12-05: qty 1

## 2023-12-05 MED ORDER — PRAVASTATIN SODIUM 40 MG PO TABS
80.0000 mg | ORAL_TABLET | Freq: Every day | ORAL | Status: DC
  Administered 2023-12-05 – 2023-12-06 (×3): 80 mg via ORAL
  Filled 2023-12-05: qty 2
  Filled 2023-12-05: qty 4

## 2023-12-05 MED ORDER — ONDANSETRON HCL 4 MG/2ML IJ SOLN: 4.0000 mg | Freq: Four times a day (QID) | INTRAMUSCULAR | Status: DC | PRN

## 2023-12-05 MED ORDER — ONDANSETRON HCL 4 MG PO TABS: 4.0000 mg | ORAL_TABLET | Freq: Four times a day (QID) | ORAL | Status: DC | PRN

## 2023-12-05 MED ORDER — IOHEXOL 350 MG/ML SOLN
100.0000 mL | Freq: Once | INTRAVENOUS | Status: AC | PRN
  Administered 2023-12-05: 100 mL via INTRAVENOUS

## 2023-12-05 MED ORDER — VITAMIN D 25 MCG (1000 UNIT) PO TABS
1000.0000 [IU] | ORAL_TABLET | Freq: Every day | ORAL | Status: DC
  Administered 2023-12-06 – 2023-12-07 (×4): 1000 [IU] via ORAL
  Filled 2023-12-05 (×2): qty 1

## 2023-12-05 MED ORDER — HEPARIN SODIUM (PORCINE) 5000 UNIT/ML IJ SOLN
5000.0000 [IU] | Freq: Three times a day (TID) | INTRAMUSCULAR | Status: DC
  Administered 2023-12-05 – 2023-12-06 (×5): 5000 [IU] via SUBCUTANEOUS
  Filled 2023-12-05 (×4): qty 1

## 2023-12-05 MED ORDER — PANTOPRAZOLE SODIUM 40 MG IV SOLR
40.0000 mg | Freq: Once | INTRAVENOUS | Status: AC
  Administered 2023-12-05: 40 mg via INTRAVENOUS
  Filled 2023-12-05: qty 10

## 2023-12-05 MED ORDER — HYDRALAZINE HCL 20 MG/ML IJ SOLN: 5.0000 mg | Freq: Four times a day (QID) | INTRAMUSCULAR | Status: DC | PRN

## 2023-12-05 MED ORDER — NITROGLYCERIN 2 % TD OINT: 1.0000 [in_us] | TOPICAL_OINTMENT | Freq: Four times a day (QID) | TRANSDERMAL | Status: DC | PRN

## 2023-12-05 MED ORDER — ACETAMINOPHEN 650 MG RE SUPP: 650.0000 mg | Freq: Four times a day (QID) | RECTAL | Status: DC | PRN

## 2023-12-05 MED ORDER — HYDRALAZINE HCL 50 MG PO TABS
25.0000 mg | ORAL_TABLET | Freq: Once | ORAL | Status: AC
  Administered 2023-12-05: 25 mg via ORAL
  Filled 2023-12-05: qty 1

## 2023-12-05 MED ORDER — ACETAMINOPHEN 325 MG PO TABS
650.0000 mg | ORAL_TABLET | Freq: Four times a day (QID) | ORAL | Status: DC | PRN
Start: 2023-12-05 — End: 2023-12-07
  Administered 2023-12-06 (×2): 650 mg via ORAL
  Filled 2023-12-05: qty 2

## 2023-12-05 MED ORDER — ASPIRIN 81 MG PO CHEW
324.0000 mg | CHEWABLE_TABLET | Freq: Once | ORAL | Status: AC
  Administered 2023-12-05: 324 mg via ORAL
  Filled 2023-12-05: qty 4

## 2023-12-05 MED ORDER — ONDANSETRON HCL 4 MG/2ML IJ SOLN
4.0000 mg | Freq: Once | INTRAMUSCULAR | Status: AC
  Administered 2023-12-05: 4 mg via INTRAVENOUS
  Filled 2023-12-05: qty 2

## 2023-12-05 NOTE — ED Notes (Signed)
 ED provider notified of BP of 192/76.

## 2023-12-05 NOTE — H&P (Addendum)
 History and Physical   Krista Anderson FMW:[id number] DOB: 02-Mar-1946 DOA: 12/05/2023  PCP: [facility]  Patient coming from: Home  I have personally briefly reviewed patient's old medical records in [facility] EMR.  Chief Concern: Chest pain  HPI: Ms. Krista Anderson is a 78 year old female with history of hypertension, hyperlipidemia, GERD, stroke, seizure, who presents emergency department for chief concerns of chest pain.  Vitals in the ED showed T of 97.9, rr 15, hr 65, blood pressure 185/80, and increased to 192/77, SpO2 of 98% on room air.  Serum sodium is 141, potassium 4.1, chloride 107, bicarb 25, BUN of 11, serum creatinine 1.02, EGFR 57, nonfasting blood glucose 99, WBC 8.9, hemoglobin 13.9, platelets of 203.  HS troponin is 5 and on repeat is 5.  CTA chest abdomen pelvis: Aortic atherosclerosis without evidence of aneurysm or dissection.  Cardiomegaly with small pericardial effusion and coronary artery calcifications.  ED treatment: Hydralazine  25 mg p.o. one-time dose, aspirin  324 mg p.o. one-time dose, morphine  2 mg IV one-time dose, ondansetron  4 mg IV one-time dose, Protonix  40 mg IV one-time dose. ---------------------------------- At bedside, patient able to tell me her name, age, current location, current calendar year.   She reports chest pain with exertion (household chores) that started in May. She describes the pain as a twinge, dull pain, at the level of an 8/10. The chest discomfort is not present now. The symptoms remain in June and July. She endorses associated shortness of breath. She denies arm discomfort that is new. She has baseline left shoulder pain from torn ligament. She denies dysuria, hematuria, diarrhea, blood in her stool, nausea, vomiting. She denies trauma to her person. She reports she has never felt this way before May of this year. She denies swelling of her lower extremities.    Family history: Father had MI at age 24. Sister  had open heart surgery, patient unsure what age. Brother had MI, patient unsure of age as well. All her siblings are deceased.  Social history: She lives at home and her son lives with her, [place]. She denies tobacco, etoh, and recreational drug use. She is retired and previously worked in a [employer].   ROS: Constitutional: no weight change, no fever ENT/Mouth: no sore throat, no rhinorrhea Eyes: no eye pain, no vision changes Cardiovascular: + chest pain, + dyspnea,  no edema, no palpitations Respiratory: no cough, no sputum, no wheezing Gastrointestinal: no nausea, no vomiting, no diarrhea, no constipation Genitourinary: no urinary incontinence, no dysuria, no hematuria Musculoskeletal: no arthralgias, no myalgias Skin: no skin lesions, no pruritus, Neuro: + weakness, no loss of consciousness, no syncope Psych: no anxiety, no depression, + decrease appetite Heme/Lymph: no bruising, no bleeding  ED Course: Discussed with EDP, patient requiring hospitalization for chief concerns of chest pain with CT read of coronary artery calcification.  Assessment/Plan  Principal Problem:   Chest pain Active Problems:   Hypertensive urgency   Stroke (cerebrum) (HCC)   HTN (hypertension)   Hyperlipidemia   Assessment and Plan:  * Chest pain Suspect unstable angina Check Am lipid panel, fasting Nitroglycerin  1 inch every 6 hours as needed for chest pain ordered on admission CTA chest abdomen pelvis: Aortic atherosclerosis without evidence of aneurysm or dissection.  Cardiomegaly with small pericardial effusion and coronary artery calcifications. Cardiology has been consulted for consideration of inpatient stress testing via Secure Chat and Epic order to [facility] cardiology. Dr. Gollan is aware. NG ointment, 1 inch q6h prn for  chest pain Echo on 09/26/23: estimated EF read as 60-65%, G1DD  Hyperlipidemia Check AM fasting lipid panel Home pravastatin  80 mg nightly resumed  HTN  (hypertension) Hydralazine  5 mg IV every 6 hours as needed for SBP greater than 165, 5 days ordered  Hypertensive urgency Status post hydralazine  25 mg p.o. one-time dose per EDP  Chart reviewed.   DVT prophylaxis: Heparin  5000 units subcutaneous every 8 hours Code Status: DNR/DNI  Diet: Heart healthy Family Communication: a phone call offered, patient declined.   Disposition Plan: Pending clinical course Consults called: cardiology, [facility]  Admission status: Telemetry cardiac, observation  Past Medical History:  Diagnosis Date   Arthritis    Borderline diabetes    Brain aneurysm    Hypertension    Pre-diabetes    Seizure (HCC) 2023   Past Surgical History:  Procedure Laterality Date   ABDOMINAL HYSTERECTOMY     APPENDECTOMY     BACK SURGERY     CARPAL TUNNEL RELEASE Bilateral    CATARACT EXTRACTION W/PHACO Left 07/29/2023   Procedure: PHACOEMULSIFICATION, CATARACT, WITH IOL INSERTION 13.65 01:17.7;  Surgeon: Enola Feliciano Hugger, MD;  Location: [facility];  Service: Ophthalmology;  Laterality: Left;   CATARACT EXTRACTION W/PHACO Right 08/12/2023   Procedure: PHACOEMULSIFICATION, CATARACT, WITH IOL INSERTION 8.03 00:50.5;  Surgeon: Enola Feliciano Hugger, MD;  Location: [facility];  Service: Ophthalmology;  Laterality: Right;   CHOLECYSTECTOMY     FRACTURE SURGERY     both wrists   Social History:  reports that she has never smoked. She has never used smokeless tobacco. She reports that she does not currently use drugs. She reports that she does not drink alcohol.  Allergies  Allergen Reactions   Gabapentin  Other (See Comments)    seizure   Elavil [Amitriptyline] Other (See Comments)    Makes me crazy   Elemental Sulfur Nausea Only   Family History  Problem Relation Age of Onset   Hypertension Mother    Heart attack Father    Heart attack Sister    Heart attack Brother    Breast cancer Neg Hx    Family history: Family history reviewed and not  pertinent.  Prior to Admission medications   Medication Sig Start Date End Date Taking? Authorizing Provider  acetaminophen  (TYLENOL ) 500 MG tablet Take 500 mg by mouth every 6 (six) hours as needed for mild pain.     [provider]  allopurinol  (ZYLOPRIM ) 100 MG tablet Take 200 mg by mouth daily. 02/26/22   [provider]  aspirin  81 MG chewable tablet Chew 81 mg by mouth daily. 01/16/09   [provider]  lisinopril  (ZESTRIL ) 40 MG tablet Take 40 mg by mouth daily.    [provider]  meloxicam (MOBIC) 15 MG tablet Take 15 mg by mouth daily. 02/25/22   [provider]  pravastatin  (PRAVACHOL ) 80 MG tablet Take 80 mg by mouth at bedtime.    [provider]  VITAMIN D -1000 MAX ST 25 MCG (1000 UT) tablet Take 1,000 Units by mouth daily. 12/05/21   [provider]  VOLTAREN  1 % GEL Apply 1 application topically daily as needed for pain. 01/15/16   [provider]   Physical Exam: Vitals:   12/05/23 1330 12/05/23 1420 12/05/23 1530 12/05/23 1630  BP: (!) 184/66 (!) 192/77 (!) 167/66 (!) 151/58  Pulse: 67  66 66  Resp: 19  19 17   Temp:      TempSrc:      SpO2:  99%  100% 100%  Weight:      Height:       Constitutional: appears age appropriate, NAD, calm Eyes: PERRL, lids and conjunctivae normal ENMT: Mucous membranes are moist. Posterior pharynx clear of any exudate or lesions. Age-appropriate dentition. Hearing appropriate Neck: normal, supple, no masses, no thyromegaly Respiratory: clear to auscultation bilaterally, no wheezing, no crackles. Normal respiratory effort. No accessory muscle use.  Cardiovascular: Regular rate and rhythm, no murmurs / rubs / gallops. No extremity edema. 2+ pedal pulses. No carotid bruits.  Abdomen: fat abdomen, no tenderness, no masses palpated, no hepatosplenomegaly. Bowel sounds positive.  Musculoskeletal: no clubbing / cyanosis. No joint deformity upper and lower extremities. Good ROM, no  contractures, no atrophy. Normal muscle tone.  Skin: no rashes, lesions, ulcers. No induration Neurologic: Sensation intact. Strength 5/5 in all 4.  Psychiatric: Normal judgment and insight. Alert and oriented x 3. Normal mood.   EKG: independently reviewed, showing sinus rhythm with rate of 72, QTc 422  Chest x-ray on Admission: I personally reviewed and I agree with radiologist reading as below.  CT Angio Chest/Abd/Pel for Dissection W and/or Wo Contrast Result Date: 12/05/2023 CLINICAL DATA:  Acute aortic syndrome suspected. Chest pain since July with increasing frequency and worsening. EXAM: CT ANGIOGRAPHY CHEST, ABDOMEN AND PELVIS TECHNIQUE: Non-contrast CT of the chest was initially obtained. Multidetector CT imaging through the chest, abdomen and pelvis was performed using the standard protocol during bolus administration of intravenous contrast. Multiplanar reconstructed images and MIPs were obtained and reviewed to evaluate the vascular anatomy. RADIATION DOSE REDUCTION: This exam was performed according to the departmental dose-optimization program which includes automated exposure control, adjustment of the mA and/or kV according to patient size and/or use of iterative reconstruction technique. CONTRAST:  OMNIPAQUE  IOHEXOL  350 MG/ML SOLN COMPARISON:  None Available. FINDINGS: CTA CHEST FINDINGS Cardiovascular: The heart is normal in size and there is a small pericardial effusion. Coronary artery calcifications are noted. There is atherosclerotic calcification of the aorta without evidence of aneurysm or dissection. No intramural hematoma is seen. The pulmonary trunk is normal in caliber. Mediastinum/Nodes: No mediastinal, hilar, or axillary lymphadenopathy. The thyroid  gland, trachea, and esophagus are within normal limits. Lungs/Pleura: Mild paraseptal and centrilobular emphysematous changes are present in the lungs. Atelectasis or scarring is noted bilaterally. No effusion or  pneumothorax is seen. There is a 4 mm subpleural nodule in the left lower lobe, axial image 95. Musculoskeletal: Degenerative changes are present in the thoracic spine. No acute fracture is seen. Review of the MIP images confirms the above findings. CTA ABDOMEN AND PELVIS FINDINGS VASCULAR Aorta: Normal caliber aorta without aneurysm, dissection, vasculitis or significant stenosis. Aortic atherosclerosis with mild irregular thrombus. Celiac: Patent without evidence of aneurysm, dissection, vasculitis or significant stenosis. SMA: Patent without evidence of aneurysm, dissection, vasculitis or significant stenosis. Renals: Both renal arteries are patent without evidence of aneurysm, dissection, vasculitis, fibromuscular dysplasia or significant stenosis. An accessory renal artery is noted on the left. IMA: Patent without evidence of aneurysm, dissection, vasculitis or significant stenosis. Inflow: Patent without evidence of aneurysm, dissection, vasculitis or significant stenosis. Veins: No obvious venous abnormality within the limitations of this arterial phase study. Review of the MIP images confirms the above findings. NON-VASCULAR Hepatobiliary: No focal liver abnormality is seen. Status post cholecystectomy. No biliary dilatation. Pancreas: Unremarkable. No pancreatic ductal dilatation or surrounding inflammatory changes. Spleen: Normal in size without focal abnormality. Adrenals/Urinary Tract: The adrenal glands are within normal limits. The kidneys enhance symmetrically.  No renal calculus or hydronephrosis bilaterally. The bladder is unremarkable. Stomach/Bowel: The stomach is within normal limits. No bowel obstruction, free air, or pneumatosis is seen. Appendix is not seen. Lymphatic: No abdominal or pelvic lymphadenopathy. Reproductive: Status post hysterectomy. No adnexal masses. Other: No abdominopelvic ascites. A fat containing umbilical hernia is noted. Musculoskeletal: Degenerative changes are present in  the lumbar spine. Spinal fusion hardware is present at L3-L4. Laminectomy changes are present at L5. No acute osseous abnormality is seen. Review of the MIP images confirms the above findings. IMPRESSION: 1. Aortic atherosclerosis without evidence of aneurysm or dissection. 2. Cardiomegaly with small pericardial effusion and coronary artery calcifications. 3. 4 mm left lower lobe pulmonary nodule. No follow-up needed if patient is low-risk.This recommendation follows the consensus statement: Guidelines for Management of Incidental Pulmonary Nodules Detected on CT Images: From the Fleischner Society 2017; Radiology 2017; 284:228-243. 4. Mild emphysema. 5. Electronically Signed   By: Leita Birmingham M.D.   On: 12/05/2023 14:16   DG Chest 2 View Result Date: 12/05/2023 CLINICAL DATA:  Chest pain EXAM: CHEST - 2 VIEW COMPARISON:  09/25/2023 FINDINGS: Incompletely imaged lumbar spine fixation. Midline trachea. Borderline cardiomegaly. Atherosclerosis in the transverse aorta. No pleural effusion or pneumothorax. EKG lead artifacts project over the upper lobes on the frontal radiograph. IMPRESSION: No acute cardiopulmonary disease. Aortic Atherosclerosis (ICD10-I70.0). Electronically Signed   By: Rockey Kilts M.D.   On: 12/05/2023 10:42   Labs on Admission: I have personally reviewed following labs  CBC: Recent Labs  Lab 12/05/23 1315  WBC 8.9  NEUTROABS 5.3  HGB 13.9  HCT 43.8  MCV 95.4  PLT 203   Basic Metabolic Panel: Recent Labs  Lab 12/05/23 1203  NA 141  K 4.1  CL 107  CO2 25  GLUCOSE 99  BUN 11  CREATININE 1.02*  CALCIUM 9.4   GFR: Estimated Creatinine Clearance: 50.5 mL/min (A) (by C-G formula based on SCr of 1.02 mg/dL (H)).  Liver Function Tests: Recent Labs  Lab 12/05/23 1203  AST 21  ALT 16  ALKPHOS 71  BILITOT 0.8  PROT 7.1  ALBUMIN 3.7   Recent Labs  Lab 12/05/23 1203  LIPASE 31   This document was prepared using Dragon Voice Recognition software and may include  unintentional dictation errors.  Dr. Sherre [facility]  If 7PM-7AM, please contact overnight-coverage provider If 7AM-7PM, please contact day attending provider [email or web address]  12/05/2023, 5:59 PM

## 2023-12-05 NOTE — ED Provider Notes (Signed)
 [facility] Provider Note    Event Date/Time   First MD Initiated Contact with Patient 12/05/23 1031     (approximate)   History   Chest Pain   HPI  Krista Anderson is a 78 y.o. female with hypertension, hyperlipidemia, GERD, seizures who comes in with chest pain.  Patient was worked up with chest pain on 5/31 until 6/1 with negative troponins, echo with a EF of 60 to 65% and recommended outpatient stress test.  Patient reports that she has had intermittent mild chest pain since discharge from the hospital in May.  She reports never following up for a stress test.  She reports that she does not have a cardiologist to go see.  She states that sometimes the pain happens with exertion symptoms at rest.  She states that yesterday she noticed the pain was a lot more worse and was in her abdomen a little bit radiated up into her chest.  Now today she reports it is more just in her chest.  She denies any shortness of breath at rest, history of blood clots.    Physical Exam   Triage Vital Signs: ED Triage Vitals  Encounter Vitals Group     BP 12/05/23 1028 (!) 201/78     Girls Systolic BP Percentile --      Girls Diastolic BP Percentile --      Boys Systolic BP Percentile --      Boys Diastolic BP Percentile --      Pulse Rate 12/05/23 1028 70     Resp 12/05/23 1028 (!) 22     Temp 12/05/23 1028 97.9 F (36.6 C)     Temp Source 12/05/23 1028 Oral     SpO2 12/05/23 1028 96 %     Weight 12/05/23 1026 201 lb (91.2 kg)     Height 12/05/23 1026 5' 4 (1.626 m)     Head Circumference --      Peak Flow --      Pain Score 12/05/23 1026 9     Pain Loc --      Pain Education --      Exclude from Growth Chart --     Most recent vital signs: Vitals:   12/05/23 1028  BP: (!) 201/78  Pulse: 70  Resp: (!) 22  Temp: 97.9 F (36.6 C)  SpO2: 96%     General: Awake, no distress.  CV:  Good peripheral perfusion.  Resp:  Normal effort.  Abd:  No distention.   Soft and nontender Other:  No swelling in legs no calf tenderness   ED Results / Procedures / Treatments   Labs (all labs ordered are listed, but only abnormal results are displayed) Labs Reviewed  BASIC METABOLIC PANEL WITH GFR  CBC WITH DIFFERENTIAL/PLATELET  LIPASE, BLOOD  HEPATIC FUNCTION PANEL  CBC WITH DIFFERENTIAL/PLATELET  TROPONIN I (HIGH SENSITIVITY)  TROPONIN I (HIGH SENSITIVITY)     EKG  My interpretation of EKG:  Normal sinus rate of 72 without any ST elevation or T wave inversions, normal intervals  RADIOLOGY I have reviewed the xray personally and interpreted no pneumonia   PROCEDURES:  Critical Care performed: No  .1-3 Lead EKG Interpretation  Performed by: Ernest Ronal BRAVO, MD Authorized by: Ernest Ronal BRAVO, MD     Interpretation: normal     ECG rate:  60   ECG rate assessment: normal     Rhythm: sinus rhythm     Ectopy: none  Conduction: normal      MEDICATIONS ORDERED IN ED: Medications  pantoprazole  (PROTONIX ) injection 40 mg (40 mg Intravenous Given 12/05/23 1314)  morphine  (PF) 2 MG/ML injection 2 mg (2 mg Intravenous Given 12/05/23 1307)  ondansetron  (ZOFRAN ) injection 4 mg (4 mg Intravenous Given 12/05/23 1309)  iohexol  (OMNIPAQUE ) 350 MG/ML injection 100 mL (100 mLs Intravenous Contrast Given 12/05/23 1319)     IMPRESSION / MDM / ASSESSMENT AND PLAN / ED COURSE  I reviewed the triage vital signs and the nursing notes.   Patient's presentation is most consistent with acute presentation with potential threat to life or bodily function.   Differential includes ACS, dissection given migratory nature and pain patient is hypertensive.  Will treat patient's pain with morphine   CBC reassuring BMP reassuring troponin negative lipase normal LFTs normal  Patient initially hypertensive but with treatment of pain patient's blood pressures have come down.  IMPRESSION: 1. Aortic atherosclerosis without evidence of aneurysm or dissection. 2.  Cardiomegaly with small pericardial effusion and coronary artery calcifications. 3. 4 mm left lower lobe pulmonary nodule. No follow-up needed if patient is low-risk.This recommendation follows the consensus statement: Guidelines for Management of Incidental Pulmonary Nodules Detected on CT Images: From the Fleischner Society 2017; Radiology 2017; 284:228-243. 4. Mild emphysema. 5.  Discussed with patient the small pericardial effusion as well as coronary artery calcifications.  Patient still reports intermittent pain she denies it being worse with positional changes as it was when she was here previously and I am concerned that she has never had a stress test done and did not get outpatient follow-up when she was here back in May.  Given this calcification her age and patient stating that her dad died from a heart attack I think it be reasonable to admit her again to discuss with cardiology for consideration of stress test given her high risk nature and continuing to have intermittent chest pain.   The patient is on the cardiac monitor to evaluate for evidence of arrhythmia and/or significant heart rate changes.      FINAL CLINICAL IMPRESSION(S) / ED DIAGNOSES   Final diagnoses:  Unstable angina (HCC)     Rx / DC Orders   ED Discharge Orders     None        Note:  This document was prepared using Dragon voice recognition software and may include unintentional dictation errors.   Ernest Ronal BRAVO, MD 12/05/23 [id number]

## 2023-12-05 NOTE — Assessment & Plan Note (Signed)
 Status post hydralazine  25 mg p.o. one-time dose per EDP

## 2023-12-05 NOTE — Assessment & Plan Note (Signed)
 Hydralazine 5 mg IV every 6 hours as needed for SBP greater than 165, 5 days ordered

## 2023-12-05 NOTE — Assessment & Plan Note (Addendum)
 Check AM fasting lipid panel Home pravastatin  80 mg nightly resumed

## 2023-12-05 NOTE — Assessment & Plan Note (Addendum)
 Suspect unstable angina Check Am lipid panel, fasting Nitroglycerin  1 inch every 6 hours as needed for chest pain ordered on admission CTA chest abdomen pelvis: Aortic atherosclerosis without evidence of aneurysm or dissection.  Cardiomegaly with small pericardial effusion and coronary artery calcifications. Cardiology has been consulted for consideration of inpatient stress testing via Secure Chat and Epic order to [facility] cardiology. Dr. Gollan is aware. NG ointment, 1 inch q6h prn for chest pain Echo on 09/26/23: estimated EF read as 60-65%, G1DD

## 2023-12-05 NOTE — ED Notes (Signed)
 Patient transported to CT

## 2023-12-05 NOTE — ED Triage Notes (Signed)
 Patient complains of chest pain since July.  Reports was here and checked out then and was told it was a muscle but pain has got worse and more frequent.  Reports its intermittent.  Complains of it being in left chest under breast and reports nausea and sweats.

## 2023-12-05 NOTE — Hospital Course (Signed)
 Ms. Krista Anderson is a 78 year old female with history of hypertension, hyperlipidemia, GERD, stroke, seizure, who presents emergency department for chief concerns of chest pain.  Vitals in the ED showed T of 97.9, rr 15, hr 65, blood pressure 185/80, and increased to 192/77, SpO2 of 98% on room air.  Serum sodium is 141, potassium 4.1, chloride 107, bicarb 25, BUN of 11, serum creatinine 1.02, EGFR 57, nonfasting blood glucose 99, WBC 8.9, hemoglobin 13.9, platelets of 203.  HS troponin is 5 and on repeat is 5.  CTA chest abdomen pelvis: Aortic atherosclerosis without evidence of aneurysm or dissection.  Cardiomegaly with small pericardial effusion and coronary artery calcifications.  ED treatment: Hydralazine  25 mg p.o. one-time dose, aspirin  324 mg p.o. one-time dose, morphine  2 mg IV one-time dose, ondansetron  4 mg IV one-time dose, Protonix  40 mg IV one-time dose.

## 2023-12-05 NOTE — ED Notes (Signed)
 Pt assisted to restroom.

## 2023-12-06 ENCOUNTER — Encounter
Admission: EM | Disposition: A | Discharge status: Home / Self Care | Payer: Self-pay | Source: Home / Self Care | Attending: Emergency Medicine

## 2023-12-06 DIAGNOSIS — I10 Essential (primary) hypertension: Secondary | ICD-10-CM | POA: Diagnosis not present

## 2023-12-06 DIAGNOSIS — I25110 Atherosclerotic heart disease of native coronary artery with unstable angina pectoris: Secondary | ICD-10-CM | POA: Diagnosis not present

## 2023-12-06 DIAGNOSIS — I200 Unstable angina: Secondary | ICD-10-CM

## 2023-12-06 DIAGNOSIS — E785 Hyperlipidemia, unspecified: Secondary | ICD-10-CM

## 2023-12-06 DIAGNOSIS — R079 Chest pain, unspecified: Secondary | ICD-10-CM | POA: Diagnosis not present

## 2023-12-06 LAB — CBC
HCT: 40.6 % (ref 36.0–46.0)
Hemoglobin: 13.2 g/dL (ref 12.0–15.0)
MCH: 30.8 pg (ref 26.0–34.0)
MCHC: 32.5 g/dL (ref 30.0–36.0)
MCV: 94.9 fL (ref 80.0–100.0)
Platelets: 178 K/uL (ref 150–400)
RBC: 4.28 MIL/uL (ref 3.87–5.11)
RDW: 14.6 % (ref 11.5–15.5)
WBC: 10.5 K/uL (ref 4.0–10.5)
nRBC: 0 % (ref 0.0–0.2)

## 2023-12-06 LAB — POCT ACTIVATED CLOTTING TIME: Activated Clotting Time: 308 s

## 2023-12-06 LAB — BASIC METABOLIC PANEL WITH GFR
Anion gap: 7 (ref 5–15)
BUN: 15 mg/dL (ref 8–23)
CO2: 26 mmol/L (ref 22–32)
Calcium: 9 mg/dL (ref 8.9–10.3)
Chloride: 105 mmol/L (ref 98–111)
Creatinine, Ser: 1.06 mg/dL — ABNORMAL HIGH (ref 0.44–1.00)
GFR, Estimated: 54 mL/min — ABNORMAL LOW (ref >60)
Glucose, Bld: 114 mg/dL — ABNORMAL HIGH (ref 70–99)
Potassium: 4 mmol/L (ref 3.5–5.1)
Sodium: 138 mmol/L (ref 135–145)

## 2023-12-06 LAB — LIPID PANEL
Cholesterol: 128 mg/dL (ref 0–200)
HDL: 43 mg/dL (ref >40)
LDL Cholesterol: 67 mg/dL (ref 0–99)
Total CHOL/HDL Ratio: 3 ratio
Triglycerides: 91 mg/dL (ref <150)
VLDL: 18 mg/dL (ref 0–40)

## 2023-12-06 SURGERY — LEFT HEART CATH AND CORONARY ANGIOGRAPHY
Anesthesia: Moderate Sedation

## 2023-12-06 MED ORDER — SODIUM CHLORIDE 0.9 % IV SOLN: INTRAVENOUS | Status: DC

## 2023-12-06 MED ORDER — SODIUM CHLORIDE 0.9% FLUSH: 3.0000 mL | Freq: Two times a day (BID) | INTRAVENOUS | Status: DC

## 2023-12-06 MED ORDER — ADENOSINE 90 MG IN NORMAL SALINE 60 ML (1 MG/ML) FOR CATH LAB
INTRACORONARY | Status: DC | PRN
  Administered 2023-12-06 (×2): 140 ug/kg/min via INTRACORONARY

## 2023-12-06 MED ORDER — LIDOCAINE HCL (PF) 1 % IJ SOLN
INTRAMUSCULAR | Status: DC | PRN
  Administered 2023-12-06 (×2): 2 mL

## 2023-12-06 MED ORDER — VERAPAMIL HCL 2.5 MG/ML IV SOLN
INTRAVENOUS | Status: DC | PRN
  Administered 2023-12-06 (×2): 2.5 mg via INTRAVENOUS

## 2023-12-06 MED ORDER — FENTANYL CITRATE (PF) 100 MCG/2ML IJ SOLN
INTRAMUSCULAR | Status: DC | PRN
  Administered 2023-12-06 (×2): 25 ug via INTRAVENOUS

## 2023-12-06 MED ORDER — VERAPAMIL HCL 2.5 MG/ML IV SOLN
INTRAVENOUS | Status: AC
Start: 2023-12-06 — End: 2023-12-06
  Filled 2023-12-06: qty 2

## 2023-12-06 MED ORDER — ASPIRIN 81 MG PO CHEW: 81.0000 mg | CHEWABLE_TABLET | ORAL | Status: AC

## 2023-12-06 MED ORDER — MIDAZOLAM HCL 2 MG/2ML IJ SOLN
INTRAMUSCULAR | Status: AC
  Filled 2023-12-06: qty 2

## 2023-12-06 MED ORDER — FREE WATER
500.0000 mL | Freq: Once | Status: AC
  Administered 2023-12-06 (×2): 500 mL via ORAL

## 2023-12-06 MED ORDER — SODIUM CHLORIDE 0.9% FLUSH
3.0000 mL | INTRAVENOUS | Status: DC | PRN
Start: 2023-12-06 — End: 2023-12-07

## 2023-12-06 MED ORDER — LACTATED RINGERS IV SOLN: INTRAVENOUS | Status: DC

## 2023-12-06 MED ORDER — HEPARIN SODIUM (PORCINE) 1000 UNIT/ML IJ SOLN
INTRAMUSCULAR | Status: AC
  Filled 2023-12-06: qty 10

## 2023-12-06 MED ORDER — IOHEXOL 300 MG/ML  SOLN
INTRAMUSCULAR | Status: DC | PRN
  Administered 2023-12-06 (×2): 103 mL

## 2023-12-06 MED ORDER — ASPIRIN 81 MG PO CHEW
81.0000 mg | CHEWABLE_TABLET | Freq: Every day | ORAL | Status: DC
  Administered 2023-12-07 (×2): 81 mg via ORAL
  Filled 2023-12-06: qty 1

## 2023-12-06 MED ORDER — HEPARIN (PORCINE) IN NACL 2000-0.9 UNIT/L-% IV SOLN
INTRAVENOUS | Status: DC | PRN
  Administered 2023-12-06 (×2): 1000 mL

## 2023-12-06 MED ORDER — SODIUM CHLORIDE 0.9 % IV SOLN: 250.0000 mL | INTRAVENOUS | Status: DC | PRN

## 2023-12-06 MED ORDER — MIDAZOLAM HCL 2 MG/2ML IJ SOLN
INTRAMUSCULAR | Status: DC | PRN
  Administered 2023-12-06 (×2): 1 mg via INTRAVENOUS

## 2023-12-06 MED ORDER — LIDOCAINE HCL 1 % IJ SOLN
INTRAMUSCULAR | Status: AC
Start: 2023-12-06 — End: 2023-12-06
  Filled 2023-12-06: qty 20

## 2023-12-06 MED ORDER — ADENOSINE (DIAGNOSTIC) 3 MG/ML IV SOLN
INTRAVENOUS | Status: AC
  Filled 2023-12-06: qty 30

## 2023-12-06 MED ORDER — HEPARIN SODIUM (PORCINE) 1000 UNIT/ML IJ SOLN
INTRAMUSCULAR | Status: DC | PRN
Start: 2023-12-06 — End: 2023-12-06
  Administered 2023-12-06 (×2): 4000 [IU] via INTRAVENOUS
  Administered 2023-12-06 (×2): 4500 [IU] via INTRAVENOUS

## 2023-12-06 MED ORDER — ASPIRIN 81 MG PO CHEW
CHEWABLE_TABLET | ORAL | Status: AC
  Administered 2023-12-06 (×2): 81 mg via ORAL
  Filled 2023-12-06: qty 1

## 2023-12-06 MED ORDER — HEPARIN (PORCINE) IN NACL 1000-0.9 UT/500ML-% IV SOLN
INTRAVENOUS | Status: AC
  Filled 2023-12-06: qty 1000

## 2023-12-06 MED ORDER — ALLOPURINOL 100 MG PO TABS
200.0000 mg | ORAL_TABLET | Freq: Every day | ORAL | Status: DC
  Administered 2023-12-06 – 2023-12-07 (×4): 200 mg via ORAL
  Filled 2023-12-06 (×2): qty 2

## 2023-12-06 MED ORDER — FENTANYL CITRATE (PF) 100 MCG/2ML IJ SOLN
INTRAMUSCULAR | Status: AC
Start: 2023-12-06 — End: 2023-12-06
  Filled 2023-12-06: qty 2

## 2023-12-06 MED ORDER — DILTIAZEM HCL ER COATED BEADS 120 MG PO CP24
120.0000 mg | ORAL_CAPSULE | Freq: Every day | ORAL | Status: DC
  Administered 2023-12-06 – 2023-12-07 (×4): 120 mg via ORAL
  Filled 2023-12-06 (×4): qty 1

## 2023-12-06 SURGICAL SUPPLY — 13 items
CATH INFINITI AMBI 5FR JK (CATHETERS) IMPLANT
CATH INFINITI JR4 5F (CATHETERS) IMPLANT
CATH LAUNCHER 6FR EBU3.5 (CATHETERS) IMPLANT
DEVICE RAD TR BAND REGULAR (VASCULAR PRODUCTS) IMPLANT
DRAPE BRACHIAL (DRAPES) IMPLANT
GLIDESHEATH SLEND SS 6F .021 (SHEATH) IMPLANT
GUIDEWIRE INQWIRE 1.5J.035X260 (WIRE) IMPLANT
GUIDEWIRE PRESSURE X 175 (WIRE) IMPLANT
KIT ESSENTIALS PG (KITS) IMPLANT
PACK CARDIAC CATH (CUSTOM PROCEDURE TRAY) ×1 IMPLANT
SET ATX-X65L (MISCELLANEOUS) IMPLANT
STATION PROTECTION PRESSURIZED (MISCELLANEOUS) IMPLANT
TUBING CIL FLEX 10 FLL-RA (TUBING) IMPLANT

## 2023-12-06 NOTE — Consult Note (Signed)
 Cardiology Consultation   Patient ID: ANYIAH COVERDALE MRN: [id number]; DOB: 01-Jan-1946  Admit date: 12/05/2023 Date of Consult: 12/06/2023  PCP:  [facility]   [facility] Providers Cardiologist:  None        Patient Profile: Krista Anderson is a 78 y.o. female with a hx of essential hypertension, hyperlipidemia, GERD, previous stroke and seizures who is being seen 12/06/2023 for the evaluation of unstable angina at the request of Dr. Sherre.  History of Present Illness: Krista Anderson is a 78 year old female with no prior cardiac history.  She was hospitalized in late May with chest pain.  Her troponin was normal.Echocardiogram at that time showed normal LV systolic function with no significant valvular abnormalities.  She was discharged home with recommendations for outpatient stress test which never happened.  She continued to have exertional chest pain and tightness.  However, she had a prolonged episode yesterday that happened at rest and lasted for about 1 hour.  She felt like a weight sitting on her chest with significant shortness of breath.  She is currently chest pain-free. Her labs showed normal troponin.  EKG with no ischemic changes.  CTA of the chest was negative for aortic aneurysm but there was mild cardiomegaly with small pericardial effusion and evidence of coronary artery calcifications.  She was hypertensive on presentation and was given oral hydralazine . She is a lifelong non-smoker.  She reports that her father died at the age of 1 of myocardial infarction.   Past Medical History:  Diagnosis Date   Arthritis    Borderline diabetes    Brain aneurysm    Hypertension    Pre-diabetes    Seizure (HCC) 2023    Past Surgical History:  Procedure Laterality Date   ABDOMINAL HYSTERECTOMY     APPENDECTOMY     BACK SURGERY     CARPAL TUNNEL RELEASE Bilateral    CATARACT EXTRACTION W/PHACO Left 07/29/2023   Procedure: PHACOEMULSIFICATION, CATARACT,  WITH IOL INSERTION 13.65 01:17.7;  Surgeon: Enola Feliciano Hugger, MD;  Location: [facility];  Service: Ophthalmology;  Laterality: Left;   CATARACT EXTRACTION W/PHACO Right 08/12/2023   Procedure: PHACOEMULSIFICATION, CATARACT, WITH IOL INSERTION 8.03 00:50.5;  Surgeon: Enola Feliciano Hugger, MD;  Location: [facility];  Service: Ophthalmology;  Laterality: Right;   CHOLECYSTECTOMY     FRACTURE SURGERY     both wrists     Home Medications:  Prior to Admission medications   Medication Sig Start Date End Date Taking? Authorizing Provider  acetaminophen  (TYLENOL ) 500 MG tablet Take 500 mg by mouth every 6 (six) hours as needed for mild pain.    Yes [provider]  allopurinol  (ZYLOPRIM ) 100 MG tablet Take 200 mg by mouth daily. 02/26/22  Yes [provider]  aspirin  81 MG chewable tablet Chew 81 mg by mouth daily. 01/16/09  Yes [provider]  lisinopril  (ZESTRIL ) 40 MG tablet Take 40 mg by mouth daily.   Yes [provider]  meloxicam (MOBIC) 15 MG tablet Take 15 mg by mouth daily. 02/25/22  Yes [provider]  Multiple Vitamin (MULTI-VITAMIN) tablet Take 1 tablet by mouth daily.   Yes [provider]  pravastatin  (PRAVACHOL ) 80 MG tablet Take 80 mg by mouth at bedtime.   Yes [provider]  VITAMIN D -1000 MAX ST 25 MCG (1000 UT) tablet Take 1,000 Units by mouth daily. 12/05/21  Yes [provider]  VOLTAREN  1 % GEL Apply 1 application topically daily  as needed for pain. Patient not taking: Reported on 12/05/2023 01/15/16   [provider]    Scheduled Meds:  cholecalciferol   1,000 Units Oral Daily   heparin   5,000 Units Subcutaneous Q8H   pravastatin   80 mg Oral QHS   Continuous Infusions:  PRN Meds: acetaminophen  **OR** acetaminophen , hydrALAZINE , nitroGLYCERIN , ondansetron  **OR** ondansetron  (ZOFRAN ) IV, senna-docusate  Allergies:    Allergies  Allergen Reactions   Gabapentin  Other  (See Comments)    seizure   Elavil [Amitriptyline] Other (See Comments)    Makes me crazy   Elemental Sulfur Nausea Only    Social History:   Social History   Socioeconomic History   Marital status: Widowed    Spouse name: Not on file   Number of children: Not on file   Years of education: Not on file   Highest education level: Not on file  Occupational History   Not on file  Tobacco Use   Smoking status: Never   Smokeless tobacco: Never  Substance and Sexual Activity   Alcohol use: No   Drug use: Not Currently   Sexual activity: Not Currently  Other Topics Concern   Not on file  Social History Narrative   Not on file   Social Drivers of Health   Financial Resource Strain: Not on file  Food Insecurity: No Food Insecurity (09/25/2023)   Hunger Vital Sign    Worried About Running Out of Food in the Last Year: Never true    Ran Out of Food in the Last Year: Never true  Transportation Needs: No Transportation Needs (09/25/2023)   PRAPARE - Administrator, Civil Service (Medical): No    Lack of Transportation (Non-Medical): No  Physical Activity: Not on file  Stress: Not on file  Social Connections: Moderately Isolated (09/25/2023)   Social Connection and Isolation Panel    Frequency of Communication with Friends and Family: More than three times a week    Frequency of Social Gatherings with Friends and Family: Once a week    Attends Religious Services: More than 4 times per year    Active Member of [employer] or Organizations: No    Attends Banker Meetings: Never    Marital Status: Widowed  Intimate Partner Violence: Not At Risk (09/25/2023)   Humiliation, Afraid, Rape, and Kick questionnaire    Fear of Current or Ex-Partner: No    Emotionally Abused: No    Physically Abused: No    Sexually Abused: No    Family History:    Family History  Problem Relation Age of Onset   Hypertension Mother    Heart attack Father    Heart attack Sister     Heart attack Brother    Breast cancer Neg Hx      ROS:  Please see the history of present illness.   All other ROS reviewed and negative.     Physical Exam/Data: Vitals:   12/06/23 0500 12/06/23 0600 12/06/23 0705 12/06/23 0815  BP: (!) 127/55 92/71  (!) 153/60  Pulse: 65 62  82  Resp: (!) 32 12  18  Temp:    98.1 F (36.7 C)  TempSrc:    Oral  SpO2: 97% 95% 97% 99%  Weight:      Height:       No intake or output data in the 24 hours ending 12/06/23 0858    12/05/2023   10:26 AM 09/25/2023    3:18 PM 09/25/2023   11:09 AM  Last 3 Weights  Weight (lbs) 201 lb 201 lb 201 lb 12.8 oz  Weight (kg) 91.173 kg 91.173 kg 91.536 kg     Body mass index is 34.5 kg/m.  General:  Well nourished, well developed, in no acute distress HEENT: normal Neck: no JVD Vascular: No carotid bruits; Distal pulses 2+ bilaterally Cardiac:  normal S1, S2; RRR; 2/6 systolic ejection murmur in the aortic area. Lungs:  clear to auscultation bilaterally, no wheezing, rhonchi or rales  Abd: soft, nontender, no hepatomegaly  Ext: no edema Musculoskeletal:  No deformities, BUE and BLE strength normal and equal Skin: warm and dry  Neuro:  CNs 2-12 intact, no focal abnormalities noted Psych:  Normal affect   EKG:  The EKG was personally reviewed and demonstrates: Normal sinus rhythm with low voltage and no significant ST or T wave changes Telemetry:  Telemetry was personally reviewed and demonstrates:    Relevant CV Studies:   Laboratory Data: High Sensitivity Troponin:   Recent Labs  Lab 12/05/23 1203 12/05/23 1352  TROPONINIHS 5 5     Chemistry Recent Labs  Lab 12/05/23 1203 12/06/23 0441  NA 141 138  K 4.1 4.0  CL 107 105  CO2 25 26  GLUCOSE 99 114*  BUN 11 15  CREATININE 1.02* 1.06*  CALCIUM 9.4 9.0  GFRNONAA 57* 54*  ANIONGAP 9 7    Recent Labs  Lab 12/05/23 1203  PROT 7.1  ALBUMIN 3.7  AST 21  ALT 16  ALKPHOS 71  BILITOT 0.8   Lipids  Recent Labs  Lab  12/06/23 0441  CHOL 128  TRIG 91  HDL 43  LDLCALC 67  CHOLHDL 3.0    Hematology Recent Labs  Lab 12/05/23 1315 12/06/23 0441  WBC 8.9 10.5  RBC 4.59 4.28  HGB 13.9 13.2  HCT 43.8 40.6  MCV 95.4 94.9  MCH 30.3 30.8  MCHC 31.7 32.5  RDW 14.6 14.6  PLT 203 178   Thyroid  No results for input(s): TSH, FREET4 in the last 168 hours.  BNPNo results for input(s): BNP, PROBNP in the last 168 hours.  DDimer No results for input(s): DDIMER in the last 168 hours.  Radiology/Studies:  CT Angio Chest/Abd/Pel for Dissection W and/or Wo Contrast Result Date: 12/05/2023 CLINICAL DATA:  Acute aortic syndrome suspected. Chest pain since July with increasing frequency and worsening. EXAM: CT ANGIOGRAPHY CHEST, ABDOMEN AND PELVIS TECHNIQUE: Non-contrast CT of the chest was initially obtained. Multidetector CT imaging through the chest, abdomen and pelvis was performed using the standard protocol during bolus administration of intravenous contrast. Multiplanar reconstructed images and MIPs were obtained and reviewed to evaluate the vascular anatomy. RADIATION DOSE REDUCTION: This exam was performed according to the departmental dose-optimization program which includes automated exposure control, adjustment of the mA and/or kV according to patient size and/or use of iterative reconstruction technique. CONTRAST:  OMNIPAQUE  IOHEXOL  350 MG/ML SOLN COMPARISON:  None Available. FINDINGS: CTA CHEST FINDINGS Cardiovascular: The heart is normal in size and there is a small pericardial effusion. Coronary artery calcifications are noted. There is atherosclerotic calcification of the aorta without evidence of aneurysm or dissection. No intramural hematoma is seen. The pulmonary trunk is normal in caliber. Mediastinum/Nodes: No mediastinal, hilar, or axillary lymphadenopathy. The thyroid  gland, trachea, and esophagus are within normal limits. Lungs/Pleura: Mild paraseptal and centrilobular emphysematous  changes are present in the lungs. Atelectasis or scarring is noted bilaterally. No effusion or pneumothorax is seen. There is a 4 mm subpleural nodule in the  left lower lobe, axial image 95. Musculoskeletal: Degenerative changes are present in the thoracic spine. No acute fracture is seen. Review of the MIP images confirms the above findings. CTA ABDOMEN AND PELVIS FINDINGS VASCULAR Aorta: Normal caliber aorta without aneurysm, dissection, vasculitis or significant stenosis. Aortic atherosclerosis with mild irregular thrombus. Celiac: Patent without evidence of aneurysm, dissection, vasculitis or significant stenosis. SMA: Patent without evidence of aneurysm, dissection, vasculitis or significant stenosis. Renals: Both renal arteries are patent without evidence of aneurysm, dissection, vasculitis, fibromuscular dysplasia or significant stenosis. An accessory renal artery is noted on the left. IMA: Patent without evidence of aneurysm, dissection, vasculitis or significant stenosis. Inflow: Patent without evidence of aneurysm, dissection, vasculitis or significant stenosis. Veins: No obvious venous abnormality within the limitations of this arterial phase study. Review of the MIP images confirms the above findings. NON-VASCULAR Hepatobiliary: No focal liver abnormality is seen. Status post cholecystectomy. No biliary dilatation. Pancreas: Unremarkable. No pancreatic ductal dilatation or surrounding inflammatory changes. Spleen: Normal in size without focal abnormality. Adrenals/Urinary Tract: The adrenal glands are within normal limits. The kidneys enhance symmetrically. No renal calculus or hydronephrosis bilaterally. The bladder is unremarkable. Stomach/Bowel: The stomach is within normal limits. No bowel obstruction, free air, or pneumatosis is seen. Appendix is not seen. Lymphatic: No abdominal or pelvic lymphadenopathy. Reproductive: Status post hysterectomy. No adnexal masses. Other: No abdominopelvic ascites. A  fat containing umbilical hernia is noted. Musculoskeletal: Degenerative changes are present in the lumbar spine. Spinal fusion hardware is present at L3-L4. Laminectomy changes are present at L5. No acute osseous abnormality is seen. Review of the MIP images confirms the above findings. IMPRESSION: 1. Aortic atherosclerosis without evidence of aneurysm or dissection. 2. Cardiomegaly with small pericardial effusion and coronary artery calcifications. 3. 4 mm left lower lobe pulmonary nodule. No follow-up needed if patient is low-risk.This recommendation follows the consensus statement: Guidelines for Management of Incidental Pulmonary Nodules Detected on CT Images: From the Fleischner Society 2017; Radiology 2017; 284:228-243. 4. Mild emphysema. 5. Electronically Signed   By: Leita Birmingham M.D.   On: 12/05/2023 14:16   DG Chest 2 View Result Date: 12/05/2023 CLINICAL DATA:  Chest pain EXAM: CHEST - 2 VIEW COMPARISON:  09/25/2023 FINDINGS: Incompletely imaged lumbar spine fixation. Midline trachea. Borderline cardiomegaly. Atherosclerosis in the transverse aorta. No pleural effusion or pneumothorax. EKG lead artifacts project over the upper lobes on the frontal radiograph. IMPRESSION: No acute cardiopulmonary disease. Aortic Atherosclerosis (ICD10-I70.0). Electronically Signed   By: Rockey Kilts M.D.   On: 12/05/2023 10:42     Assessment and Plan: Unstable angina: The patient has been having exertional chest pain since May which progressed to a prolonged episode of chest pain at rest yesterday.  Fortunately, no evidence of myocardial infarction by cardiac enzymes and EKG is without any ischemic changes.  However, her presentation is very concerning for unstable angina.  I discussed different diagnostic options with her including noninvasive versus invasive testing and I favor proceeding with left heart catheterization and possible PCI.  I discussed the procedure in details as well as risk and  benefits. Hyperlipidemia: Will switch to a more potent statin if coronary artery disease is confirmed. Essential hypertension: Blood pressure was elevated on presentation likely in the setting of chest pain.  Her blood pressure is improved now.  Will adjust medications as needed.  Informed Consent   Shared Decision Making/Informed Consent The risks [stroke (1 in 1000), death (1 in 1000), kidney failure [usually temporary] (1 in 500), bleeding (1 in  200), allergic reaction [possibly serious] (1 in 200)], benefits (diagnostic support and management of coronary artery disease) and alternatives of a cardiac catheterization were discussed in detail with Ms. Zacarias and she is willing to proceed.     For questions or updates, please contact [facility] Please consult [email or web address] for contact info under    Signed, Deatrice Cage, MD  12/06/2023 8:58 AM

## 2023-12-06 NOTE — ED Notes (Signed)
 Pt instructed to undress for procedure.

## 2023-12-06 NOTE — ED Notes (Signed)
 Report gave to cath lab RN

## 2023-12-06 NOTE — Progress Notes (Signed)
 PROGRESS NOTE    Krista Anderson  FMW:[id number] DOB: 17-Oct-1945 DOA: 12/05/2023 [facility]CP: [facility]  Chief Complaint  Patient presents with   Chest Pain    Hospital Course:  Krista Anderson is a 78 year old female with history of hypertension, hyperlipidemia, GERD, stroke, seizure presented with chest pain, Seen by Cardiology, underwent LHC which was non obstructive. Hospital course as below  Subjective: Patient was examined at the bedside post LHC, states no chest pain. Denies any complaints. Hopeful to go home tomorrow   Objective: Vitals:   12/06/23 0500 12/06/23 0600 12/06/23 0705 12/06/23 0815  BP: (!) 127/55 92/71  (!) 153/60  Pulse: 65 62  82  Resp: (!) 32 12  18  Temp:    98.1 F (36.7 C)  TempSrc:    Oral  SpO2: 97% 95% 97% 99%  Weight:      Height:       No intake or output data in the 24 hours ending 12/06/23 0933 Filed Weights   12/05/23 1026  Weight: 91.2 kg    Examination: Constitutional: appears age appropriate, NAD, calm Neck: normal, supple, no masses, no thyromegaly Respiratory: clear to auscultation bilaterally, no wheezing, no crackles. Normal respiratory effort. No accessory muscle use.  Cardiovascular: Regular rate and rhythm, no murmurs / rubs / gallops. No extremity edema. 2+ pedal pulses. No carotid bruits Abdomen: fat abdomen, no tenderness, no masses palpated, no hepatosplenomegaly. Bowel sounds positive. Musculoskeletal: no clubbing / cyanosis. No joint deformity upper and lower extremities. Good ROM, no contractures, no atrophy. Normal muscle tone.  Skin: no rashes, lesions, ulcers. No induration Neurologic: Sensation intact. Strength 5/5 in all 4   Assessment & Plan:  Principal Problem:   Chest pain Active Problems:   Hypertensive urgency   Stroke (cerebrum) (HCC)   HTN (hypertension)   Hyperlipidemia  Non obstructive CAD Unstable Angina - Presented with chest pain with exertion ongoing since 05/25, progressed  to chest pain at rest - Trop x 2 neg, EKG no ischemic changes - Echo on 09/26/23: estimated EF read as 60-65%, G1DD - CTA chest abdomen pelvis: Aortic atherosclerosis without evidence of aneurysm or dissection.  Cardiomegaly with small pericardial effusion and coronary artery calcifications - Seen by Cardiology, appreciate recs - s/p LHC 08/11 mild to moderate nonobstructive two-vessel CAD.  Proximal LAD to mid LAD 40% stenosed.  Ost RCA 40% stenosed - Continue aspirin , statin.  Added Cardizem  - Can add ranolazine if chest pain persists  Hyperlipidemia LDL 64, HbA1c pending  Home pravastatin  80 mg resumed  Hypertensive urgency HTN (hypertension) - started Cardizem  - Resume Lisinopril  as BP tolerates  Elevated Cr - Baseline Cr 0.8-0.9, Monitor Cr  Incidental findings: 4mm LLL pulmonary nodule. No follow up if low risk (non smoker)   DVT prophylaxis: Heparin  SQ   Code Status: Limited: Do not attempt resuscitation (DNR) -DNR-LIMITED -Do Not Intubate/DNI  Disposition:  Home  Consultants:  Treatment Team:  Consulting Physician: Perla Evalene PARAS, MD  Procedures:  LHC  Antimicrobials:  Anti-infectives (From admission, onward)    None       Data Reviewed: I have personally reviewed following labs and imaging studies CBC: Recent Labs  Lab 12/05/23 1315 12/06/23 0441  WBC 8.9 10.5  NEUTROABS 5.3  --   HGB 13.9 13.2  HCT 43.8 40.6  MCV 95.4 94.9  PLT 203 178   Basic Metabolic Panel: Recent Labs  Lab 12/05/23 1203 12/06/23 0441  NA 141 138  K 4.1 4.0  CL 107 105  CO2 25 26  GLUCOSE 99 114*  BUN 11 15  CREATININE 1.02* 1.06*  CALCIUM 9.4 9.0   GFR: Estimated Creatinine Clearance: 48.6 mL/min (A) (by C-G formula based on SCr of 1.06 mg/dL (H)). Liver Function Tests: Recent Labs  Lab 12/05/23 1203  AST 21  ALT 16  ALKPHOS 71  BILITOT 0.8  PROT 7.1  ALBUMIN 3.7   CBG: No results for input(s): GLUCAP in the last 168 hours.  No results found for  this or any previous visit (from the past 240 hours).   Radiology Studies: CT Angio Chest/Abd/Pel for Dissection W and/or Wo Contrast Result Date: 12/05/2023 CLINICAL DATA:  Acute aortic syndrome suspected. Chest pain since July with increasing frequency and worsening. EXAM: CT ANGIOGRAPHY CHEST, ABDOMEN AND PELVIS TECHNIQUE: Non-contrast CT of the chest was initially obtained. Multidetector CT imaging through the chest, abdomen and pelvis was performed using the standard protocol during bolus administration of intravenous contrast. Multiplanar reconstructed images and MIPs were obtained and reviewed to evaluate the vascular anatomy. RADIATION DOSE REDUCTION: This exam was performed according to the departmental dose-optimization program which includes automated exposure control, adjustment of the mA and/or kV according to patient size and/or use of iterative reconstruction technique. CONTRAST:  OMNIPAQUE  IOHEXOL  350 MG/ML SOLN COMPARISON:  None Available. FINDINGS: CTA CHEST FINDINGS Cardiovascular: The heart is normal in size and there is a small pericardial effusion. Coronary artery calcifications are noted. There is atherosclerotic calcification of the aorta without evidence of aneurysm or dissection. No intramural hematoma is seen. The pulmonary trunk is normal in caliber. Mediastinum/Nodes: No mediastinal, hilar, or axillary lymphadenopathy. The thyroid  gland, trachea, and esophagus are within normal limits. Lungs/Pleura: Mild paraseptal and centrilobular emphysematous changes are present in the lungs. Atelectasis or scarring is noted bilaterally. No effusion or pneumothorax is seen. There is a 4 mm subpleural nodule in the left lower lobe, axial image 95. Musculoskeletal: Degenerative changes are present in the thoracic spine. No acute fracture is seen. Review of the MIP images confirms the above findings. CTA ABDOMEN AND PELVIS FINDINGS VASCULAR Aorta: Normal caliber aorta without aneurysm,  dissection, vasculitis or significant stenosis. Aortic atherosclerosis with mild irregular thrombus. Celiac: Patent without evidence of aneurysm, dissection, vasculitis or significant stenosis. SMA: Patent without evidence of aneurysm, dissection, vasculitis or significant stenosis. Renals: Both renal arteries are patent without evidence of aneurysm, dissection, vasculitis, fibromuscular dysplasia or significant stenosis. An accessory renal artery is noted on the left. IMA: Patent without evidence of aneurysm, dissection, vasculitis or significant stenosis. Inflow: Patent without evidence of aneurysm, dissection, vasculitis or significant stenosis. Veins: No obvious venous abnormality within the limitations of this arterial phase study. Review of the MIP images confirms the above findings. NON-VASCULAR Hepatobiliary: No focal liver abnormality is seen. Status post cholecystectomy. No biliary dilatation. Pancreas: Unremarkable. No pancreatic ductal dilatation or surrounding inflammatory changes. Spleen: Normal in size without focal abnormality. Adrenals/Urinary Tract: The adrenal glands are within normal limits. The kidneys enhance symmetrically. No renal calculus or hydronephrosis bilaterally. The bladder is unremarkable. Stomach/Bowel: The stomach is within normal limits. No bowel obstruction, free air, or pneumatosis is seen. Appendix is not seen. Lymphatic: No abdominal or pelvic lymphadenopathy. Reproductive: Status post hysterectomy. No adnexal masses. Other: No abdominopelvic ascites. A fat containing umbilical hernia is noted. Musculoskeletal: Degenerative changes are present in the lumbar spine. Spinal fusion hardware is present at L3-L4. Laminectomy changes are present at L5. No acute osseous abnormality is seen. Review of the MIP  images confirms the above findings. IMPRESSION: 1. Aortic atherosclerosis without evidence of aneurysm or dissection. 2. Cardiomegaly with small pericardial effusion and coronary  artery calcifications. 3. 4 mm left lower lobe pulmonary nodule. No follow-up needed if patient is low-risk.This recommendation follows the consensus statement: Guidelines for Management of Incidental Pulmonary Nodules Detected on CT Images: From the Fleischner Society 2017; Radiology 2017; 284:228-243. 4. Mild emphysema. 5. Electronically Signed   By: Leita Birmingham M.D.   On: 12/05/2023 14:16   DG Chest 2 View Result Date: 12/05/2023 CLINICAL DATA:  Chest pain EXAM: CHEST - 2 VIEW COMPARISON:  09/25/2023 FINDINGS: Incompletely imaged lumbar spine fixation. Midline trachea. Borderline cardiomegaly. Atherosclerosis in the transverse aorta. No pleural effusion or pneumothorax. EKG lead artifacts project over the upper lobes on the frontal radiograph. IMPRESSION: No acute cardiopulmonary disease. Aortic Atherosclerosis (ICD10-I70.0). Electronically Signed   By: Rockey Kilts M.D.   On: 12/05/2023 10:42    Scheduled Meds:  cholecalciferol   1,000 Units Oral Daily   heparin   5,000 Units Subcutaneous Q8H   pravastatin   80 mg Oral QHS   Continuous Infusions:   LOS: 0 days  MDM: Patient is high risk for one or more organ failure.  They necessitate ongoing hospitalization for continued IV therapies and subsequent lab monitoring. Total time spent interpreting labs and vitals, reviewing the medical record, coordinating care amongst consultants and care team members, directly assessing and discussing care with the patient and/or family: 55 min  Laree Lock, MD [facility]  To contact the attending physician between 7A-7P please use Epic Chat. To contact the covering physician during after hours 7P-7A, please review Amion.  12/06/2023, 9:33 AM   *This document has been created with the assistance of dictation software. Please excuse typographical errors. *

## 2023-12-06 NOTE — ED Notes (Signed)
 Pt ambulated to the restroom and back

## 2023-12-07 ENCOUNTER — Encounter: Payer: Self-pay | Admitting: Cardiovascular Disease

## 2023-12-07 DIAGNOSIS — I200 Unstable angina: Secondary | ICD-10-CM | POA: Diagnosis not present

## 2023-12-07 DIAGNOSIS — R079 Chest pain, unspecified: Secondary | ICD-10-CM | POA: Diagnosis not present

## 2023-12-07 LAB — CBC
HCT: 40.2 % (ref 36.0–46.0)
Hemoglobin: 13.1 g/dL (ref 12.0–15.0)
MCH: 30.7 pg (ref 26.0–34.0)
MCHC: 32.6 g/dL (ref 30.0–36.0)
MCV: 94.1 fL (ref 80.0–100.0)
Platelets: 184 K/uL (ref 150–400)
RBC: 4.27 MIL/uL (ref 3.87–5.11)
RDW: 14.7 % (ref 11.5–15.5)
WBC: 9.3 K/uL (ref 4.0–10.5)
nRBC: 0 % (ref 0.0–0.2)

## 2023-12-07 LAB — BASIC METABOLIC PANEL WITH GFR
Anion gap: 6 (ref 5–15)
BUN: 13 mg/dL (ref 8–23)
CO2: 27 mmol/L (ref 22–32)
Calcium: 8.9 mg/dL (ref 8.9–10.3)
Chloride: 107 mmol/L (ref 98–111)
Creatinine, Ser: 0.86 mg/dL (ref 0.44–1.00)
GFR, Estimated: >60 mL/min (ref >60)
Glucose, Bld: 103 mg/dL — ABNORMAL HIGH (ref 70–99)
Potassium: 4.1 mmol/L (ref 3.5–5.1)
Sodium: 140 mmol/L (ref 135–145)

## 2023-12-07 LAB — HEMOGLOBIN A1C
Hgb A1c MFr Bld: 6.1 % — ABNORMAL HIGH (ref 4.8–5.6)
Mean Plasma Glucose: 128 mg/dL

## 2023-12-07 MED ORDER — LISINOPRIL 10 MG PO TABS: 10.0000 mg | ORAL_TABLET | Freq: Two times a day (BID) | ORAL | 1 refills | Status: AC

## 2023-12-07 MED ORDER — DILTIAZEM HCL ER COATED BEADS 120 MG PO CP24
120.0000 mg | ORAL_CAPSULE | Freq: Every day | ORAL | 1 refills | Status: AC
Start: 2023-12-08 — End: ?

## 2023-12-07 NOTE — Progress Notes (Signed)
 Rounding Note   Patient Name: Krista Anderson Date of Encounter: 12/07/2023  [facility] Cardiologist: Deatrice Cage, MD   Subjective Patient denies any further chest pain. No shortness of breath. LHC done yesterday showed mild nonobstructive 2 vessel CAD with microvascular dysfunction. Cath site is stable with some mild bruising.  Scheduled Meds:  allopurinol   200 mg Oral Daily   aspirin   81 mg Oral Daily   cholecalciferol   1,000 Units Oral Daily   diltiazem   120 mg Oral Daily   heparin   5,000 Units Subcutaneous Q8H   pravastatin   80 mg Oral QHS   Continuous Infusions:  sodium chloride      PRN Meds: sodium chloride , acetaminophen  **OR** acetaminophen , hydrALAZINE , nitroGLYCERIN , ondansetron  **OR** ondansetron  (ZOFRAN ) IV, senna-docusate, sodium chloride  flush   Vital Signs  Vitals:   12/06/23 1739 12/07/23 0554 12/07/23 0753 12/07/23 1100  BP: (!) 166/66 (!) 135/45 (!) 121/55 (!) 158/61  Pulse: 73 69 80 72  Resp: 18 18 18 18   Temp: 98.6 F (37 C) 98.3 F (36.8 C) 98.2 F (36.8 C) 98.2 F (36.8 C)  TempSrc:      SpO2: 98% 97% 92% 93%  Weight:      Height:        Intake/Output Summary (Last 24 hours) at 12/07/2023 1301 Last data filed at 12/07/2023 0900 Gross per 24 hour  Intake 980 ml  Output --  Net 980 ml      12/05/2023   10:26 AM 09/25/2023    3:18 PM 09/25/2023   11:09 AM  Last 3 Weights  Weight (lbs) 201 lb 201 lb 201 lb 12.8 oz  Weight (kg) 91.173 kg 91.173 kg 91.536 kg      Telemetry Sinus rhythm - Personally Reviewed  Physical Exam  GEN: No acute distress.   Neck: No JVD Cardiac: RRR, no murmurs, rubs, or gallops. Cath site is stable with mild hematoma. Soft with palpable pulse.  Respiratory: Clear to auscultation bilaterally. GI: Soft, nontender, non-distended  MS: No edema; No deformity. Neuro:  Nonfocal  Psych: Normal affect   Labs High Sensitivity Troponin:   Recent Labs  Lab 12/05/23 1203 12/05/23 1352  TROPONINIHS 5 5      Chemistry Recent Labs  Lab 12/05/23 1203 12/06/23 0441 12/07/23 0425  NA 141 138 140  K 4.1 4.0 4.1  CL 107 105 107  CO2 25 26 27   GLUCOSE 99 114* 103*  BUN 11 15 13   CREATININE 1.02* 1.06* 0.86  CALCIUM 9.4 9.0 8.9  PROT 7.1  --   --   ALBUMIN 3.7  --   --   AST 21  --   --   ALT 16  --   --   ALKPHOS 71  --   --   BILITOT 0.8  --   --   GFRNONAA 57* 54* >60  ANIONGAP 9 7 6     Lipids  Recent Labs  Lab 12/06/23 0441  CHOL 128  TRIG 91  HDL 43  LDLCALC 67  CHOLHDL 3.0    Hematology Recent Labs  Lab 12/05/23 1315 12/06/23 0441 12/07/23 0425  WBC 8.9 10.5 9.3  RBC 4.59 4.28 4.27  HGB 13.9 13.2 13.1  HCT 43.8 40.6 40.2  MCV 95.4 94.9 94.1  MCH 30.3 30.8 30.7  MCHC 31.7 32.5 32.6  RDW 14.6 14.6 14.7  PLT 203 178 184   Thyroid  No results for input(s): TSH, FREET4 in the last 168 hours.  BNPNo results for input(s): BNP,  PROBNP in the last 168 hours.  DDimer No results for input(s): DDIMER in the last 168 hours.   Radiology  CT Angio Chest/Abd/Pel for Dissection W and/or Wo Contrast Result Date: 12/05/2023 IMPRESSION: 1. Aortic atherosclerosis without evidence of aneurysm or dissection. 2. Cardiomegaly with small pericardial effusion and coronary artery calcifications. 3. 4 mm left lower lobe pulmonary nodule. No follow-up needed if patient is low-risk.This recommendation follows the consensus statement: Guidelines for Management of Incidental Pulmonary Nodules Detected on CT Images: From the Fleischner Society 2017; Radiology 2017; 284:228-243. 4. Mild emphysema. 5. Electronically Signed   By: Leita Birmingham M.D.   On: 12/05/2023 14:16    Cardiac Studies  12/06/2023 LHC   Prox LAD to Mid LAD lesion is 40% stenosed.   Ost RCA lesion is 40% stenosed.   The left ventricular systolic function is normal.   LV end diastolic pressure is normal.   The left ventricular ejection fraction is 55-65% by visual estimate.   1.  Mild to moderate  nonobstructive two-vessel coronary artery disease. 2.  Normal LV systolic function and normal left ventricular end-diastolic pressure. 3.  There is evidence of significant coronary microvascular dysfunction with CFR of 1.5 and IMR of 35.  09/26/2023 Echo complete 1. TDS.   2. Left ventricular ejection fraction, by estimation, is 60 to 65%. The  left ventricle has normal function. The left ventricle has no regional  wall motion abnormalities. Left ventricular diastolic parameters are  consistent with Grade I diastolic  dysfunction (impaired relaxation).   3. Right ventricular systolic function is low normal. The right  ventricular size is mildly enlarged.   4. The mitral valve is normal in structure. Trivial mitral valve  regurgitation.   5. The aortic valve is normal in structure. Aortic valve regurgitation is  not visualized.   Patient Profile   78 y.o. female with a history of hypertension, hyperlipidemia, GERD, stroke, and seizures who is being seen for the ongoing evaluation of chest pain.   Assessment & Plan   Nonobstructive CAD Microvascular dysfunction - Patient presented 8/10 with several months of exertional chest pain progressing to a prolonged episode at rest - Recent echo 09/2023 showed normal LV systolic function - Troponin negative - EKG without acute ischemic changes - Underwent cardiac catheterization yesterday which showed mild to moderate nonobstructive 2 vessel CAD and microvascular dysfunction - Denies further chest pain - Continue diltiazem  120 mg daily, started yesterday for management of microvascular dysfunction - Can consider addition of ranolazine in the future if needed - Continue ASA and statin therapy   Hyperlipidemia - LDL 67, continue statin therapy   Essential hypertension - BP mildly elevated - Continue diltiazem  as above - Consider resuming PTA antihypertensives  For questions or updates, please contact [facility] Please consult  [email or web address] for contact info under     Signed, Lesley LITTIE Maffucci, PA-C  12/07/2023, 1:01 PM

## 2023-12-07 NOTE — Care Management Obs Status (Signed)
 MEDICARE OBSERVATION STATUS NOTIFICATION   Patient Details  Name: Krista Anderson MRN: [id number] Date of Birth: 1946/04/14   Medicare Observation Status Notification Given:  No (did not want a copy)    Rojelio [name] 12/07/2023, 10:32 AM

## 2023-12-07 NOTE — Discharge Summary (Signed)
 Physician Discharge Summary   Patient: Krista Anderson MRN: [id number] DOB: [birth year]  Admit date:     12/05/2023  Discharge date: 12/07/23  Discharge Physician: Laree Lock   [facility]: [facility]   Recommendations at discharge:   Follow up with PCP in 1-2 week - Monitor BP and can increase Lisinopril  as needed 4mm Pulmonary nodule - Incidental finding  Discharge Diagnoses: Principal Problem:   Chest pain Active Problems:   Hypertensive urgency   Stroke (cerebrum) (HCC)   HTN (hypertension)   Hyperlipidemia   Unstable angina (HCC)  Resolved Problems:   * No resolved hospital problems. [facility] Medical Course: Krista Anderson is a 78 year old female with history of hypertension, hyperlipidemia, GERD, stroke, seizure presented with chest pain, Seen by Cardiology, underwent LHC which was non obstructive. Hospital course as below    Non obstructive CAD Microvascular dysfunction - Presented with chest pain with exertion ongoing since 05/25, progressed to chest pain at rest - Trop x 2 neg, EKG no ischemic changes - Echo on 09/26/23: estimated EF read as 60-65%, G1DD - CTA chest abdomen pelvis: Aortic atherosclerosis without evidence of aneurysm or dissection.  Cardiomegaly with small pericardial effusion and coronary artery calcifications - Seen by Cardiology, s/p LHC 08/11 mild to moderate nonobstructive two-vessel CAD.  Proximal LAD to mid LAD 40% stenosed.  Ost RCA 40% stenosed and microvascular dysfunction - Continue aspirin , statin.  Added Cardizem  for microvascular dysfunction - Chest pain resolved, Can add ranolazine if chest pain persists   Hyperlipidemia LDL 64 Home pravastatin  80 mg resumed  Prediabetes - HbA1c 6.1   Hypertensive urgency HTN (hypertension) - started Cardizem  - Resume Lisinopril  at 10mg  (home dose 40mg ) - Follow up with PCP   Incidental findings: 4mm LLL pulmonary nodule. No follow up if low risk (non smoker)  Consultants:  Cardiology Procedures performed: LHC  Disposition: Home Diet recommendation:  Discharge Diet Orders (From admission, onward)     Start     Ordered   12/07/23 0000  Diet - low sodium heart healthy        12/07/23 1328            DISCHARGE MEDICATION: Allergies as of 12/07/2023       Reactions   Gabapentin  Other (See Comments)   seizure   Elavil [amitriptyline] Other (See Comments)   Makes me crazy   Elemental Sulfur Nausea Only        Medication List     TAKE these medications    acetaminophen  500 MG tablet Commonly known as: TYLENOL  Take 500 mg by mouth every 6 (six) hours as needed for mild pain.   allopurinol  100 MG tablet Commonly known as: ZYLOPRIM  Take 200 mg by mouth daily.   aspirin  81 MG chewable tablet Chew 81 mg by mouth daily.   diltiazem  120 MG 24 hr capsule Commonly known as: CARDIZEM  CD Take 1 capsule (120 mg total) by mouth daily. Start taking on: December 08, 2023   lisinopril  10 MG tablet Commonly known as: Zestril  Take 1 tablet (10 mg total) by mouth 2 (two) times daily. What changed:  medication strength how much to take when to take this   meloxicam 15 MG tablet Commonly known as: MOBIC Take 15 mg by mouth daily.   Multi-Vitamin tablet Take 1 tablet by mouth daily.   pravastatin  80 MG tablet Commonly known as: PRAVACHOL  Take 80 mg by mouth at bedtime.   Vitamin D -1000 Max St 25 MCG (1000 UT)  tablet Generic drug: Cholecalciferol  Take 1,000 Units by mouth daily.   Voltaren  1 % Gel Generic drug: diclofenac  Sodium Apply 1 application topically daily as needed for pain.        Discharge Exam: Filed Weights   12/05/23 1026  Weight: 91.2 kg   Constitutional: appears age appropriate, NAD, calm Neck: normal, supple, no masses, no thyromegaly Respiratory: clear to auscultation bilaterally, no wheezing, no crackles. Normal respiratory effort. No accessory muscle use.  Cardiovascular: Regular rate and rhythm, no murmurs /  rubs / gallops. No extremity edema. 2+ pedal pulses. No carotid bruits Abdomen: fat abdomen, no tenderness, no masses palpated, no hepatosplenomegaly. Bowel sounds positive. Musculoskeletal: no clubbing / cyanosis. No joint deformity upper and lower extremities. Good ROM, no contractures, no atrophy. Normal muscle tone.  Skin: no rashes, lesions, ulcers. No induration Neurologic: Sensation intact. Strength 5/5 in all extremities  Condition at discharge: good  The results of significant diagnostics from this hospitalization (including imaging, microbiology, ancillary and laboratory) are listed below for reference.   Imaging Studies: CARDIAC CATHETERIZATION Addendum Date: 12/06/2023   Prox LAD to Mid LAD lesion is 40% stenosed.   Ost RCA lesion is 40% stenosed.   The left ventricular systolic function is normal.   LV end diastolic pressure is normal.   The left ventricular ejection fraction is 55-65% by visual estimate. 1.  Mild to moderate nonobstructive two-vessel coronary artery disease. 2.  Normal LV systolic function and normal left ventricular end-diastolic pressure. 3.  There is evidence of significant coronary microvascular dysfunction with CFR of 1.5 and IMR of 35. Recommendations: Recommend low-dose aspirin  and a statin. Will add a calcium channel blocker and can consider adding ranolazine in the future if needed.  Result Date: 12/06/2023   Prox LAD to Mid LAD lesion is 40% stenosed.   Ost RCA lesion is 40% stenosed.   The left ventricular systolic function is normal.   LV end diastolic pressure is normal.   The left ventricular ejection fraction is 55-65% by visual estimate. 1.  Mild to moderate nonobstructive two-vessel coronary artery disease. 2.  Normal LV systolic function and normal left ventricular end-diastolic pressure. 3.  There is evidence of significant cardiac microvascular dysfunction with CFR of 1.5 and IMR of 35. Recommendations: Recommend low-dose aspirin  and a statin. Will add  a calcium channel blocker and can consider adding ranolazine in the future if needed.   CT Angio Chest/Abd/Pel for Dissection W and/or Wo Contrast Result Date: 12/05/2023 CLINICAL DATA:  Acute aortic syndrome suspected. Chest pain since July with increasing frequency and worsening. EXAM: CT ANGIOGRAPHY CHEST, ABDOMEN AND PELVIS TECHNIQUE: Non-contrast CT of the chest was initially obtained. Multidetector CT imaging through the chest, abdomen and pelvis was performed using the standard protocol during bolus administration of intravenous contrast. Multiplanar reconstructed images and MIPs were obtained and reviewed to evaluate the vascular anatomy. RADIATION DOSE REDUCTION: This exam was performed according to the departmental dose-optimization program which includes automated exposure control, adjustment of the mA and/or kV according to patient size and/or use of iterative reconstruction technique. CONTRAST:  OMNIPAQUE  IOHEXOL  350 MG/ML SOLN COMPARISON:  None Available. FINDINGS: CTA CHEST FINDINGS Cardiovascular: The heart is normal in size and there is a small pericardial effusion. Coronary artery calcifications are noted. There is atherosclerotic calcification of the aorta without evidence of aneurysm or dissection. No intramural hematoma is seen. The pulmonary trunk is normal in caliber. Mediastinum/Nodes: No mediastinal, hilar, or axillary lymphadenopathy. The thyroid  gland, trachea, and esophagus  are within normal limits. Lungs/Pleura: Mild paraseptal and centrilobular emphysematous changes are present in the lungs. Atelectasis or scarring is noted bilaterally. No effusion or pneumothorax is seen. There is a 4 mm subpleural nodule in the left lower lobe, axial image 95. Musculoskeletal: Degenerative changes are present in the thoracic spine. No acute fracture is seen. Review of the MIP images confirms the above findings. CTA ABDOMEN AND PELVIS FINDINGS VASCULAR Aorta: Normal caliber aorta without  aneurysm, dissection, vasculitis or significant stenosis. Aortic atherosclerosis with mild irregular thrombus. Celiac: Patent without evidence of aneurysm, dissection, vasculitis or significant stenosis. SMA: Patent without evidence of aneurysm, dissection, vasculitis or significant stenosis. Renals: Both renal arteries are patent without evidence of aneurysm, dissection, vasculitis, fibromuscular dysplasia or significant stenosis. An accessory renal artery is noted on the left. IMA: Patent without evidence of aneurysm, dissection, vasculitis or significant stenosis. Inflow: Patent without evidence of aneurysm, dissection, vasculitis or significant stenosis. Veins: No obvious venous abnormality within the limitations of this arterial phase study. Review of the MIP images confirms the above findings. NON-VASCULAR Hepatobiliary: No focal liver abnormality is seen. Status post cholecystectomy. No biliary dilatation. Pancreas: Unremarkable. No pancreatic ductal dilatation or surrounding inflammatory changes. Spleen: Normal in size without focal abnormality. Adrenals/Urinary Tract: The adrenal glands are within normal limits. The kidneys enhance symmetrically. No renal calculus or hydronephrosis bilaterally. The bladder is unremarkable. Stomach/Bowel: The stomach is within normal limits. No bowel obstruction, free air, or pneumatosis is seen. Appendix is not seen. Lymphatic: No abdominal or pelvic lymphadenopathy. Reproductive: Status post hysterectomy. No adnexal masses. Other: No abdominopelvic ascites. A fat containing umbilical hernia is noted. Musculoskeletal: Degenerative changes are present in the lumbar spine. Spinal fusion hardware is present at L3-L4. Laminectomy changes are present at L5. No acute osseous abnormality is seen. Review of the MIP images confirms the above findings. IMPRESSION: 1. Aortic atherosclerosis without evidence of aneurysm or dissection. 2. Cardiomegaly with small pericardial effusion and  coronary artery calcifications. 3. 4 mm left lower lobe pulmonary nodule. No follow-up needed if patient is low-risk.This recommendation follows the consensus statement: Guidelines for Management of Incidental Pulmonary Nodules Detected on CT Images: From the Fleischner Society 2017; Radiology 2017; 284:228-243. 4. Mild emphysema. 5. Electronically Signed   By: Leita Birmingham M.D.   On: 12/05/2023 14:16   DG Chest 2 View Result Date: 12/05/2023 CLINICAL DATA:  Chest pain EXAM: CHEST - 2 VIEW COMPARISON:  09/25/2023 FINDINGS: Incompletely imaged lumbar spine fixation. Midline trachea. Borderline cardiomegaly. Atherosclerosis in the transverse aorta. No pleural effusion or pneumothorax. EKG lead artifacts project over the upper lobes on the frontal radiograph. IMPRESSION: No acute cardiopulmonary disease. Aortic Atherosclerosis (ICD10-I70.0). Electronically Signed   By: Rockey Kilts M.D.   On: 12/05/2023 10:42    Microbiology: Results for orders placed or performed during the hospital encounter of 02/21/16  Surgical pcr screen     Status: None   Collection Time: 02/21/16  9:43 AM   Specimen: Nasal Mucosa; Nasal Swab  Result Value Ref Range Status   MRSA, PCR NEGATIVE NEGATIVE Final   Staphylococcus aureus NEGATIVE NEGATIVE Final    Comment:        The Xpert SA Assay (FDA approved for NASAL specimens in patients over 27 years of age), is one component of a comprehensive surveillance program.  Test performance has been validated by [facility] for patients greater than or equal to 18 year old. It is not intended to diagnose infection nor to guide or monitor treatment.  Labs: CBC: Recent Labs  Lab 12/05/23 1315 12/06/23 0441 12/07/23 0425  WBC 8.9 10.5 9.3  NEUTROABS 5.3  --   --   HGB 13.9 13.2 13.1  HCT 43.8 40.6 40.2  MCV 95.4 94.9 94.1  PLT 203 178 184   Basic Metabolic Panel: Recent Labs  Lab 12/05/23 1203 12/06/23 0441 12/07/23 0425  NA 141 138 140  K 4.1 4.0 4.1   CL 107 105 107  CO2 25 26 27   GLUCOSE 99 114* 103*  BUN 11 15 13   CREATININE 1.02* 1.06* 0.86  CALCIUM 9.4 9.0 8.9   Liver Function Tests: Recent Labs  Lab 12/05/23 1203  AST 21  ALT 16  ALKPHOS 71  BILITOT 0.8  PROT 7.1  ALBUMIN 3.7   CBG: No results for input(s): GLUCAP in the last 168 hours.  Discharge time spent: less than 30 minutes.  Signed: Laree Lock, MD [facility] 12/07/2023

## 2023-12-07 NOTE — TOC CM/SW Note (Signed)
 Transition of Care [facility]) - Inpatient Brief Assessment   Patient Details  Name: Krista Anderson MRN: [id number] Date of Birth: 1945-12-20  Transition of Care [facility]) CM/SW Contact:    Lauraine JAYSON Carpen, LCSW Phone Number: 12/07/2023, 11:35 AM   Clinical Narrative: CSW reviewed chart. No TOC needs identified so far. CSW will continue to follow progress. Please place [facility] consult if any needs arise.  Transition of Care Asessment: Insurance and Status: Insurance coverage has been reviewed Patient has primary care physician: Yes Home environment has been reviewed: Single family home Prior level of function:: Not documented Prior/Current Home Services: No current home services Social Drivers of Health Review: SDOH reviewed no interventions necessary Readmission risk has been reviewed: Yes Transition of care needs: no transition of care needs at this time

## 2023-12-07 NOTE — Plan of Care (Signed)

## 2023-12-30 ENCOUNTER — Ambulatory Visit: Admitting: Cardiology

## 2024-05-26 ENCOUNTER — Other Ambulatory Visit: Payer: Self-pay

## 2024-05-26 ENCOUNTER — Emergency Department

## 2024-05-26 ENCOUNTER — Emergency Department
Admission: EM | Admit: 2024-05-26 | Discharge: 2024-05-27 | Disposition: A | Attending: Emergency Medicine | Admitting: Emergency Medicine

## 2024-05-26 DIAGNOSIS — R55 Syncope and collapse: Secondary | ICD-10-CM | POA: Insufficient documentation

## 2024-05-26 DIAGNOSIS — R200 Anesthesia of skin: Secondary | ICD-10-CM | POA: Diagnosis present

## 2024-05-26 DIAGNOSIS — D72829 Elevated white blood cell count, unspecified: Secondary | ICD-10-CM | POA: Insufficient documentation

## 2024-05-26 LAB — URINALYSIS, ROUTINE W REFLEX MICROSCOPIC
Bilirubin Urine: NEGATIVE
Glucose, UA: NEGATIVE mg/dL
Hgb urine dipstick: NEGATIVE
Ketones, ur: NEGATIVE mg/dL
Nitrite: NEGATIVE
Protein, ur: NEGATIVE mg/dL
Specific Gravity, Urine: 1.003 — ABNORMAL LOW (ref 1.005–1.030)
pH: 6 (ref 5.0–8.0)

## 2024-05-26 LAB — CBC
HCT: 42.8 % (ref 36.0–46.0)
Hemoglobin: 14 g/dL (ref 12.0–15.0)
MCH: 30.6 pg (ref 26.0–34.0)
MCHC: 32.7 g/dL (ref 30.0–36.0)
MCV: 93.4 fL (ref 80.0–100.0)
Platelets: 222 10*3/uL (ref 150–400)
RBC: 4.58 MIL/uL (ref 3.87–5.11)
RDW: 14.7 % (ref 11.5–15.5)
WBC: 10.8 10*3/uL — ABNORMAL HIGH (ref 4.0–10.5)
nRBC: 0 % (ref 0.0–0.2)

## 2024-05-26 LAB — COMPREHENSIVE METABOLIC PANEL WITH GFR
ALT: 16 U/L (ref 0–44)
AST: 20 U/L (ref 15–41)
Albumin: 4.3 g/dL (ref 3.5–5.0)
Alkaline Phosphatase: 82 U/L (ref 38–126)
Anion gap: 12 (ref 5–15)
BUN: 11 mg/dL (ref 8–23)
CO2: 25 mmol/L (ref 22–32)
Calcium: 9.5 mg/dL (ref 8.9–10.3)
Chloride: 104 mmol/L (ref 98–111)
Creatinine, Ser: 0.85 mg/dL (ref 0.44–1.00)
GFR, Estimated: >60 mL/min
Glucose, Bld: 135 mg/dL — ABNORMAL HIGH (ref 70–99)
Potassium: 4.4 mmol/L (ref 3.5–5.1)
Sodium: 141 mmol/L (ref 135–145)
Total Bilirubin: 0.3 mg/dL (ref 0.0–1.2)
Total Protein: 7.3 g/dL (ref 6.5–8.1)

## 2024-05-26 LAB — TROPONIN T, HIGH SENSITIVITY: Troponin T High Sensitivity: 8 ng/L (ref 0–19)

## 2024-05-26 LAB — CBG MONITORING, ED: Glucose-Capillary: 121 mg/dL — ABNORMAL HIGH (ref 70–99)

## 2024-05-26 MED ORDER — IOHEXOL 350 MG/ML SOLN
75.0000 mL | Freq: Once | INTRAVENOUS | Status: AC | PRN
  Administered 2024-05-26: 75 mL via INTRAVENOUS

## 2024-05-26 NOTE — ED Provider Notes (Signed)
 "  [facility] Provider Note    Event Date/Time   First MD Initiated Contact with Patient 05/26/24 2207     (approximate)   History   Numbness   HPI  Krista Anderson is a 79 y.o. female who presents to the emergency department today because of concerns for bilateral hand numbness, tingling as well as near syncopal episode.  The patient states that the near-syncope started about 2 weeks ago.  She does state that she did pass out 2 weeks ago.  Today however she just felt like she was going to pass out.  Additionally she has noticed some tingling in bilateral hands has been going on and off for about the past week.  She does feel somewhat weakness in her hands from this.  She denies any concurrent headache, palpitations or chest pain.     Physical Exam   Triage Vital Signs: ED Triage Vitals  Encounter Vitals Group     BP 05/26/24 1852 (!) 174/86     Girls Systolic BP Percentile --      Girls Diastolic BP Percentile --      Boys Systolic BP Percentile --      Boys Diastolic BP Percentile --      Pulse Rate 05/26/24 1852 74     Resp 05/26/24 1852 18     Temp 05/26/24 1853 97.7 F (36.5 C)     Temp src --      SpO2 05/26/24 1852 98 %     Weight 05/26/24 1854 200 lb (90.7 kg)     Height 05/26/24 1854 5' 4 (1.626 m)     Head Circumference --      Peak Flow --      Pain Score 05/26/24 1853 0     Pain Loc --      Pain Education --      Exclude from Growth Chart --     Most recent vital signs: Vitals:   05/26/24 1852 05/26/24 1853  BP: (!) 174/86   Pulse: 74   Resp: 18   Temp:  97.7 F (36.5 C)  SpO2: 98%    General: Awake, alert, oriented. CV:  Good peripheral perfusion. Regular rate and rhythm. Resp:  Normal effort. Lungs clear. Abd:  No distention.  Other:  Face symmetric. EOMI. Strength 5/5 in upper and lower extremities. Sensation grossly intact.    ED Results / Procedures / Treatments   Labs (all labs ordered are listed, but only  abnormal results are displayed) Labs Reviewed  COMPREHENSIVE METABOLIC PANEL WITH GFR - Abnormal; Notable for the following components:      Result Value   Glucose, Bld 135 (*)    All other components within normal limits  CBC - Abnormal; Notable for the following components:   WBC 10.8 (*)    All other components within normal limits  URINALYSIS, ROUTINE W REFLEX MICROSCOPIC - Abnormal; Notable for the following components:   Color, Urine STRAW (*)    APPearance CLEAR (*)    Specific Gravity, Urine 1.003 (*)    Leukocytes,Ua TRACE (*)    Bacteria, UA RARE (*)    All other components within normal limits  CBG MONITORING, ED - Abnormal; Notable for the following components:   Glucose-Capillary 121 (*)    All other components within normal limits     EKG  I, Guadalupe Eagles, attending physician, personally viewed and interpreted this EKG  EKG Time: 1904 Rate: 70 Rhythm: normal sinus  rhythm Axis: left axis deviation Intervals: qtc 419 QRS: low voltage, narrow ST changes: no st elevation Impression: abnormal ekg   RADIOLOGY I independently interpreted and visualized the CT angio head and neck. My interpretation: No ICH, no acute abnormality Radiology interpretation: Pending   PROCEDURES:  Critical Care performed: No    MEDICATIONS ORDERED IN ED: Medications - No data to display   IMPRESSION / MDM / ASSESSMENT AND PLAN / ED COURSE  I reviewed the triage vital signs and the nursing notes.                              Differential diagnosis includes, but is not limited to, electrolyte abnormality, vitamin deficiency, neuropathy, intracranial process, anemia, ACS  Patient's presentation is most consistent with acute presentation with potential threat to life or bodily function.   Patient presented to the emergency department today because of concerns for both tingling to the upper extremities as well as near syncope episodes.  On exam patient awake and alert.  No  focal neurodeficits.  EKG without concerning arrhythmia or abnormality.  Blood work without significant electrolyte abnormality or anemia.  Troponin was negative.  Patient does state that she was told she has some stenosis of an artery in the past.  Because of this we will check CT angio head and neck.  If CT without concerning abnormality I do think it be reasonable for patient be discharged to follow-up with primary care.      FINAL CLINICAL IMPRESSION(S) / ED DIAGNOSES   Final diagnoses:  Numbness  Near syncope      Note:  This document was prepared using Dragon voice recognition software and may include unintentional dictation errors.    Floy Roberts, MD 05/26/24 [phone]  "

## 2024-05-26 NOTE — ED Triage Notes (Signed)
 Pt to ED via POV for numbness and LOC. Pt reports that Sunday she felt like both hands went numb at the same time. Pt reports that it lasts around 15 seconds and then returns. Pt reports that she has had near syncopal episodes x2 weeks.Pt reports she has had LOC once. Pt reports that both hands are numb but right arm and R side of lips also becomes numb.

## 2024-05-27 NOTE — ED Notes (Signed)
MD Wong at bedside

## 2024-05-27 NOTE — ED Notes (Signed)
 Pt is requesting to leave prior to CT results. Risks and benefits explained to pt and family who verbalized understanding. MD Cyrena made aware.

## 2024-05-29 ENCOUNTER — Telehealth: Payer: Self-pay | Admitting: Emergency Medicine

## 2024-05-29 NOTE — Telephone Encounter (Signed)
 Called patient due to left prior to CT resulting.  Explained that there is something they see near parotid and that ED physician recommended follow up with ENT.  Patient says she is feeling better.  She has appt on Wednesday with her pcp at [facility].  I told her she can go to her doctor and ask them to review the CT.  Then they can tell her if she needs to go to ENT.  I did explain that this finding may have nothing to do with her presenting symptoms.  She says she will inform her pcp.
